# Patient Record
Sex: Female | Born: 1995 | Race: Black or African American | Hispanic: No | Marital: Single | Smoking: Current some day smoker
Health system: Southern US, Community
[De-identification: ages and names within clinical notes are randomized; demographics above are authoritative.]

## PROBLEM LIST (undated history)

## (undated) DIAGNOSIS — R011 Cardiac murmur, unspecified: Secondary | ICD-10-CM

---
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
## 2016-01-18 ENCOUNTER — Encounter: Payer: Medicaid Other | Admitting: Obstetrics and Gynecology

## 2016-04-10 ENCOUNTER — Emergency Department (HOSPITAL_COMMUNITY)
Admission: EM | Admit: 2016-04-10 | Discharge: 2016-04-10 | Disposition: A | Discharge status: Home / Self Care | Payer: Medicaid Other | Attending: Emergency Medicine | Admitting: Emergency Medicine

## 2016-04-10 ENCOUNTER — Encounter (HOSPITAL_COMMUNITY): Payer: Self-pay | Admitting: Emergency Medicine

## 2016-04-10 DIAGNOSIS — R21 Rash and other nonspecific skin eruption: Secondary | ICD-10-CM

## 2016-04-10 DIAGNOSIS — B86 Scabies: Secondary | ICD-10-CM | POA: Diagnosis not present

## 2016-04-10 DIAGNOSIS — F17210 Nicotine dependence, cigarettes, uncomplicated: Secondary | ICD-10-CM | POA: Diagnosis not present

## 2016-04-10 MED ORDER — DIPHENHYDRAMINE HCL 25 MG PO TABS: 25.0000 mg | ORAL_TABLET | Freq: Four times a day (QID) | ORAL | 0 refills | Status: DC | PRN

## 2016-04-10 MED ORDER — DIPHENHYDRAMINE HCL 25 MG PO CAPS
50.0000 mg | ORAL_CAPSULE | Freq: Once | ORAL | Status: AC
  Administered 2016-04-10: 50 mg via ORAL
  Filled 2016-04-10: qty 2

## 2016-04-10 MED ORDER — PERMETHRIN 5 % EX CREA: TOPICAL_CREAM | CUTANEOUS | 1 refills | Status: DC

## 2016-04-10 MED ORDER — DEXAMETHASONE SODIUM PHOSPHATE 10 MG/ML IJ SOLN
10.0000 mg | Freq: Once | INTRAMUSCULAR | Status: AC
  Administered 2016-04-10: 10 mg via INTRAMUSCULAR
  Filled 2016-04-10: qty 1

## 2016-04-10 NOTE — ED Provider Notes (Signed)
[facility]-EMERGENCY DEPT Provider Note   CSN: [id number] Arrival date & time: 04/10/16  1229  First Provider Contact:  12:35 PM  By signing my name below, I, Kari Steele, attest that this documentation has been prepared under the direction and in the presence of Kyrillos Adams Y Amor Hyle, [place]. Electronically Signed: Evon Steele, ED Scribe. 04/10/16. 12:41 PM.   History   Chief Complaint Chief Complaint  Patient presents with  . Rash    HPI Kari Steele is a 20 y.o. female.  The history is provided by the patient. No language interpreter was used.  HPI Comments: Kari Steele is a 20 y.o. female who presents to the Emergency Department complaining of worsening generalized itchy rash that began 1 week prior. Pt states that the rash is mostly located on her torso and spreading. She states they start as bumps and some get bigger. They are itchy but not painful. Denies blisters. Denies drainage. Pt states that tried washing in selsum blue with no relief.  Pt denies being in contact with any with similar rash. Denies fever or chills. Denies recent travel or new exposures. Denies vaginal symptoms. She states she was recently tested for all STDs including syphilis and was negative. States she has never had chicken pox.    No past medical history on file.  There are no active problems to display for this patient.   No past surgical history on file.  OB History    No data available       Home Medications    Prior to Admission medications   Not on File    Family History No family history on file.  Social History Social History  Substance Use Topics  . Smoking status: Current Some Day Smoker    Types: Cigars  . Smokeless tobacco: Not on file  . Alcohol use Yes     Allergies   Review of patient's allergies indicates no known allergies.   Review of Systems Review of Systems  Skin: Positive for rash.     Physical Exam Updated Vital Signs BP 137/75   Pulse (!)  52   Resp 16   Ht  (1.626 m)   Wt 130 lb (59 kg)   SpO2 100%   BMI 22.31 kg/m   Physical Exam  Constitutional: She is oriented to person, place, and time. She appears well-developed and well-nourished. No distress.  HENT:  Head: Normocephalic and atraumatic.  Eyes: Conjunctivae and EOM are normal.  Neck: Neck supple. No tracheal deviation present.  Cardiovascular: Normal rate.   Pulmonary/Chest: Effort normal. No respiratory distress.  Musculoskeletal: Normal range of motion.  Neurological: She is alert and oriented to person, place, and time.  Skin: Skin is warm and dry.  Diffuse papular rash, papules of varying size from 1-79mm to 5-93mm. Most are scratched open and scabbed over. No vesicles or pustules. The rash is in bilateral axilla, bilateral arms, across chest, abdomen, and bilateral inner thigh creases.   Psychiatric: She has a normal mood and affect. Her behavior is normal.  Nursing note and vitals reviewed.    ED Treatments / Results  Labs (all labs ordered are listed, but only abnormal results are displayed) Labs Reviewed - No data to display  EKG  EKG Interpretation None       Radiology No results found.  Procedures Procedures (including critical care time)  Medications Ordered in ED Medications - No data to display   Initial Impression / Assessment and Plan / ED  Course  I have reviewed the triage vital signs and the nursing notes.  Pertinent labs & imaging results that were available during my care of the patient were reviewed by me and considered in my medical decision making (see chart for details).  Clinical Course   Pt presenting with diffuse papular pruritic rash that has evolved over 1.5 weeks. Nobody else at home has similar rash. No new exposures or travel. The rash is the most concentrated in her axilla and inner thigh creases. It appears like scabies though I do not see any burrow lines. We will treat as such with rx for permethrin  cream. Will also give rx for benadryl as needed for itching. Scabies instructions discussed. Encouraged derm and PCP f/u. ER return precautions given.   I personally performed the services described in this documentation, which was scribed in my presence. The recorded information has been reviewed and is accurate.   Final Clinical Impressions(s) / ED Diagnoses   Final diagnoses:  Rash  Scabies    New Prescriptions Discharge Medication List as of 04/10/2016  1:13 PM    START taking these medications   Details  diphenhydrAMINE (BENADRYL) 25 MG tablet Take 1 tablet (25 mg total) by mouth every 6 (six) hours as needed for itching., Starting Sun 04/10/2016, Print    permethrin (ELIMITE) 5 % cream Apply to entire body. Wait 12 hrs then shower. Repeat in one week., Print         Carlene Coria, PA-C 04/10/16 1447    Azalia Bilis, MD 04/10/16 [phone]

## 2016-04-10 NOTE — Discharge Instructions (Signed)
Your rash appears consistent with scabies. Use the Permethrin cream as prescribed. Take benadryl as needed for itching. Follow up with dermatology if your symptoms do not improve.

## 2016-04-10 NOTE — ED Notes (Signed)
PA at the bedside.

## 2016-04-10 NOTE — ED Triage Notes (Signed)
Patient reports itching x 1.5 weeks.  Reports this as generalized.  Denies fevers.

## 2016-04-21 ENCOUNTER — Telehealth (HOSPITAL_COMMUNITY): Payer: Self-pay

## 2016-04-21 NOTE — Telephone Encounter (Signed)
Patient calling states dog ate Rx for Permethrin 5% cream and would like it called in to pharmacy.  Spoke w/S. Sam PA prescriber, OK to call in new Rx as written previously.  Rx called to [facility] [phone] and was left on pharmacy VM.

## 2016-07-19 ENCOUNTER — Encounter (HOSPITAL_COMMUNITY): Payer: Self-pay

## 2016-07-19 ENCOUNTER — Emergency Department (HOSPITAL_COMMUNITY)
Admission: EM | Admit: 2016-07-19 | Discharge: 2016-07-19 | Disposition: A | Discharge status: Home / Self Care | Payer: Medicaid Other | Attending: Emergency Medicine | Admitting: Emergency Medicine

## 2016-07-19 DIAGNOSIS — S060X0A Concussion without loss of consciousness, initial encounter: Secondary | ICD-10-CM | POA: Diagnosis not present

## 2016-07-19 DIAGNOSIS — Z79899 Other long term (current) drug therapy: Secondary | ICD-10-CM | POA: Insufficient documentation

## 2016-07-19 DIAGNOSIS — W1800XA Striking against unspecified object with subsequent fall, initial encounter: Secondary | ICD-10-CM | POA: Insufficient documentation

## 2016-07-19 DIAGNOSIS — Y929 Unspecified place or not applicable: Secondary | ICD-10-CM | POA: Diagnosis not present

## 2016-07-19 DIAGNOSIS — F17290 Nicotine dependence, other tobacco product, uncomplicated: Secondary | ICD-10-CM | POA: Insufficient documentation

## 2016-07-19 DIAGNOSIS — Y9389 Activity, other specified: Secondary | ICD-10-CM | POA: Diagnosis not present

## 2016-07-19 DIAGNOSIS — Y999 Unspecified external cause status: Secondary | ICD-10-CM | POA: Diagnosis not present

## 2016-07-19 DIAGNOSIS — S0003XA Contusion of scalp, initial encounter: Secondary | ICD-10-CM | POA: Diagnosis not present

## 2016-07-19 DIAGNOSIS — S0990XA Unspecified injury of head, initial encounter: Secondary | ICD-10-CM | POA: Diagnosis present

## 2016-07-19 MED ORDER — IBUPROFEN 800 MG PO TABS
800.0000 mg | ORAL_TABLET | Freq: Once | ORAL | Status: AC
  Administered 2016-07-19: 800 mg via ORAL
  Filled 2016-07-19: qty 1

## 2016-07-19 MED ORDER — ONDANSETRON 4 MG PO TBDP
4.0000 mg | ORAL_TABLET | Freq: Once | ORAL | Status: AC
  Administered 2016-07-19: 4 mg via ORAL
  Filled 2016-07-19: qty 1

## 2016-07-19 NOTE — Progress Notes (Addendum)
Patient listed as having Medicaid [place] [employer] insurance without pap.  Patent confirms her pcp is located at [facility] [place].  System updated.
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
## 2016-11-03 ENCOUNTER — Emergency Department (HOSPITAL_COMMUNITY)
Admission: EM | Admit: 2016-11-03 | Discharge: 2016-11-03 | Disposition: A | Discharge status: Home / Self Care | Payer: Medicaid Other | Attending: Emergency Medicine | Admitting: Emergency Medicine

## 2016-11-03 ENCOUNTER — Encounter (HOSPITAL_COMMUNITY): Payer: Self-pay | Admitting: Emergency Medicine

## 2016-11-03 ENCOUNTER — Emergency Department (HOSPITAL_COMMUNITY): Payer: Medicaid Other

## 2016-11-03 DIAGNOSIS — J40 Bronchitis, not specified as acute or chronic: Secondary | ICD-10-CM | POA: Diagnosis not present

## 2016-11-03 DIAGNOSIS — F17290 Nicotine dependence, other tobacco product, uncomplicated: Secondary | ICD-10-CM | POA: Diagnosis not present

## 2016-11-03 DIAGNOSIS — Z79899 Other long term (current) drug therapy: Secondary | ICD-10-CM | POA: Insufficient documentation

## 2016-11-03 DIAGNOSIS — N390 Urinary tract infection, site not specified: Secondary | ICD-10-CM | POA: Insufficient documentation

## 2016-11-03 DIAGNOSIS — R062 Wheezing: Secondary | ICD-10-CM

## 2016-11-03 LAB — URINALYSIS, ROUTINE W REFLEX MICROSCOPIC
BILIRUBIN URINE: NEGATIVE
Glucose, UA: NEGATIVE mg/dL
HGB URINE DIPSTICK: NEGATIVE
KETONES UR: NEGATIVE mg/dL
LEUKOCYTES UA: NEGATIVE
NITRITE: POSITIVE — AB
Protein, ur: NEGATIVE mg/dL
SPECIFIC GRAVITY, URINE: 1.008 (ref 1.005–1.030)
SQUAMOUS EPITHELIAL / LPF: NONE SEEN
pH: 7 (ref 5.0–8.0)

## 2016-11-03 LAB — POC URINE PREG, ED: Preg Test, Ur: NEGATIVE

## 2016-11-03 MED ORDER — PREDNISONE 20 MG PO TABS
60.0000 mg | ORAL_TABLET | Freq: Once | ORAL | Status: AC
  Administered 2016-11-03: 60 mg via ORAL
  Filled 2016-11-03: qty 3

## 2016-11-03 MED ORDER — BENZONATATE 100 MG PO CAPS: 100.0000 mg | ORAL_CAPSULE | Freq: Three times a day (TID) | ORAL | 0 refills | Status: DC

## 2016-11-03 MED ORDER — IPRATROPIUM-ALBUTEROL 0.5-2.5 (3) MG/3ML IN SOLN
3.0000 mL | Freq: Once | RESPIRATORY_TRACT | Status: AC
  Administered 2016-11-03: 3 mL via RESPIRATORY_TRACT
  Filled 2016-11-03: qty 3

## 2016-11-03 MED ORDER — PREDNISONE 20 MG PO TABS: 40.0000 mg | ORAL_TABLET | Freq: Every day | ORAL | 0 refills | Status: DC

## 2016-11-03 MED ORDER — ALBUTEROL SULFATE HFA 108 (90 BASE) MCG/ACT IN AERS
2.0000 | INHALATION_SPRAY | Freq: Once | RESPIRATORY_TRACT | Status: AC
  Administered 2016-11-03: 2 via RESPIRATORY_TRACT
  Filled 2016-11-03: qty 6.7

## 2016-11-03 MED ORDER — NITROFURANTOIN MONOHYD MACRO 100 MG PO CAPS: 100.0000 mg | ORAL_CAPSULE | Freq: Two times a day (BID) | ORAL | 0 refills | Status: DC

## 2016-11-03 MED ORDER — AEROCHAMBER PLUS FLO-VU MEDIUM MISC
1.0000 | Freq: Once | Status: AC
  Administered 2016-11-03: 1
  Filled 2016-11-03: qty 1

## 2016-11-03 NOTE — ED Triage Notes (Signed)
Pt reports nonproductive cough for the past 3 weeks. Coughing episodes cause SOB with wheezing at times. Worse at night. No known hx of asthma.

## 2016-11-03 NOTE — ED Provider Notes (Signed)
[facility]-EMERGENCY DEPT Provider Note   CSN: [id number] Arrival date & time: 11/03/16  [id number]     History   Chief Complaint Chief Complaint  Patient presents with  . Cough    HPI Kari Steele is a 21 y.o. female.  HPI Kari Steele is a 21 y.o. female with no medical problems, presents to ED with complaint of cough and wheezing. Pt reports cough for the last 3 weeks, at times productive, reports associated wheezing. Nothing making her cough better or worse but states it is usually worse at night and keeps her up. She has tried nyquil with no relief. No fever or chills. No nasal congestion or sore throat. No hx of asthma or wheezing in the past. Pt also requests pregnancy test.  History reviewed. No pertinent past medical history.  There are no active problems to display for this patient.   History reviewed. No pertinent surgical history.  OB History    No data available       Home Medications    Prior to Admission medications   Medication Sig Start Date End Date Taking? Authorizing Provider  diphenhydrAMINE (BENADRYL) 25 MG tablet Take 1 tablet (25 mg total) by mouth every 6 (six) hours as needed for itching. 04/10/16   Ace GinsSerena Y Sam, PA-C  ibuprofen (ADVIL,MOTRIN) 200 MG tablet Take 600 mg by mouth every 6 (six) hours as needed for moderate pain.    Historical Provider, MD  permethrin (ELIMITE) 5 % cream Apply to entire body. Wait 12 hrs then shower. Repeat in one week. Patient not taking: Reported on 07/19/2016 04/10/16   Carlene CoriaSerena Y Sam, PA-C    Family History History reviewed. No pertinent family history.  Social History Social History  Substance Use Topics  . Smoking status: Current Some Day Smoker    Types: Cigars  . Smokeless tobacco: Never Used  . Alcohol use Yes     Allergies   Patient has no known allergies.   Review of Systems Review of Systems  Constitutional: Negative for chills and fever.  Respiratory: Positive for cough, chest tightness,  shortness of breath and wheezing.   Cardiovascular: Negative for chest pain, palpitations and leg swelling.  Gastrointestinal: Negative for abdominal pain, diarrhea, nausea and vomiting.  Genitourinary: Negative for dysuria, flank pain and pelvic pain.  Musculoskeletal: Negative for arthralgias, myalgias, neck pain and neck stiffness.  Skin: Negative for rash.  Neurological: Negative for dizziness, weakness and headaches.  All other systems reviewed and are negative.    Physical Exam Updated Vital Signs BP 123/78 (BP Location: Left Arm)   Pulse (!) 56   Temp 98.2 F (36.8 C) (Oral)   Resp 14   Ht 5' 4.5" (1.638 m)   Wt 58.1 kg   SpO2 100%   BMI 21.63 kg/m   Physical Exam  Constitutional: She is oriented to person, place, and time. She appears well-developed and well-nourished. No distress.  HENT:  Head: Normocephalic and atraumatic.  Right Ear: Tympanic membrane, external ear and ear canal normal.  Left Ear: Tympanic membrane, external ear and ear canal normal.  Nose: Mucosal edema and rhinorrhea present.  Mouth/Throat: Uvula is midline and mucous membranes are normal. Posterior oropharyngeal erythema present. No oropharyngeal exudate, posterior oropharyngeal edema or tonsillar abscesses.  Eyes: Conjunctivae are normal.  Neck: Neck supple.  Cardiovascular: Normal rate, regular rhythm, normal heart sounds and intact distal pulses.   Pulmonary/Chest: Effort normal. No respiratory distress. She has wheezes. She has no rales.  Inspiratory and  expiratory wheezes bilaterally. No distress. Speaking in full sentences.   Abdominal: She exhibits no distension.  Musculoskeletal: Normal range of motion.  Neurological: She is alert and oriented to person, place, and time.  Skin: Skin is warm and dry.  Psychiatric: She has a normal mood and affect.  Nursing note and vitals reviewed.    ED Treatments / Results  Labs (all labs ordered are listed, but only abnormal results are  displayed) Labs Reviewed  POC URINE PREG, ED    EKG  EKG Interpretation None       Radiology Dg Chest 2 View  Result Date: 11/03/2016 CLINICAL DATA:  Shortness of breath, and wheezing for the past 3 weeks. Symptoms greatest at night. Former smoker. EXAM: CHEST  2 VIEW COMPARISON:  None in PACs FINDINGS: The lungs are mildly hyperinflated. There is no significant hemidiaphragm flattening. There is no focal infiltrate and no pleural effusion or pneumothorax. The heart and mediastinal structures are normal. The trachea is midline. The bony thorax exhibits no acute abnormality. IMPRESSION: There is no pneumonia nor other acute cardiopulmonary abnormality. Mild hyperinflation may be voluntary or may reflect reactive airway disease. Electronically Signed   By: David  [place] M.D.   On: 11/03/2016 10:19    Procedures Procedures (including critical care time)  Medications Ordered in ED Medications  ipratropium-albuterol (DUONEB) 0.5-2.5 (3) MG/3ML nebulizer solution 3 mL (not administered)  predniSONE (DELTASONE) tablet 60 mg (not administered)     Initial Impression / Assessment and Plan / ED Course  I have reviewed the triage vital signs and the nursing notes.  Pertinent labs & imaging results that were available during my care of the patient were reviewed by me and considered in my medical decision making (see chart for details).    Patient in emergency department with cough for 3 weeks and wheezing. No history of wheezing or asthma in the past. Patient is also requesting pregnancy test. Will check pregnancy test and get chest x-ray. Will do a breathing treatment for wheezing, prednisone.   Patient's chest x-ray is negative. Her wheezing completely resolved after 1 DuoNeb. Her pregnancy test is negative. She apparently also asked to have urinalysis done to the some dysuria, and was concerned about UTI, urinalysis was performed. Urine is nitrite positive with some bacteria. Will treat  with Macrobid. Home with prednisone, inhaler, Tessalon for cough. Patient is afebrile, nontoxic appearing, I do not think this is a bacterial infection, and I do not think she requires antibiotics. She is otherwise stable. Instructed to follow with the primary care doctor.  Vitals:   11/03/16 0826  BP: 123/78  Pulse: (!) 56  Resp: 14  Temp: 98.2 F (36.8 C)  TempSrc: Oral  SpO2: 100%  Weight: 58.1 kg  Height: 5' 4.5" (1.638 m)      Final Clinical Impressions(s) / ED Diagnoses   Final diagnoses:  Bronchitis  Wheezing  Urinary tract infection without hematuria, site unspecified    New Prescriptions Discharge Medication List as of 11/03/2016 11:42 AM    START taking these medications   Details  benzonatate (TESSALON) 100 MG capsule Take 1 capsule (100 mg total) by mouth every 8 (eight) hours., Starting Thu 11/03/2016, Print    nitrofurantoin, macrocrystal-monohydrate, (MACROBID) 100 MG capsule Take 1 capsule (100 mg total) by mouth 2 (two) times daily., Starting Thu 11/03/2016, Print    predniSONE (DELTASONE) 20 MG tablet Take 2 tablets (40 mg total) by mouth daily., Starting Thu 11/03/2016, Print  Jaynie Crumble, PA-C 11/03/16 1530    Jacalyn Lefevre, MD 11/10/16 1332

## 2016-11-03 NOTE — Discharge Instructions (Signed)
Use inhaler 2 puffs every 4 hrs for cough and wheezing. Take prednisone as prescribed until all gone, next dose tomorrow. Take macrobid as prescribed until all gone for UTI. Follow up with primary care doctor or return if not improving.

## 2016-11-27 ENCOUNTER — Encounter (HOSPITAL_COMMUNITY): Payer: Self-pay | Admitting: Emergency Medicine

## 2016-11-27 ENCOUNTER — Emergency Department (HOSPITAL_COMMUNITY)
Admission: EM | Admit: 2016-11-27 | Discharge: 2016-11-27 | Disposition: A | Discharge status: Home / Self Care | Payer: Medicaid Other | Attending: Emergency Medicine | Admitting: Emergency Medicine

## 2016-11-27 ENCOUNTER — Emergency Department (HOSPITAL_COMMUNITY): Payer: Medicaid Other

## 2016-11-27 DIAGNOSIS — Z79899 Other long term (current) drug therapy: Secondary | ICD-10-CM | POA: Diagnosis not present

## 2016-11-27 DIAGNOSIS — J4541 Moderate persistent asthma with (acute) exacerbation: Secondary | ICD-10-CM | POA: Diagnosis not present

## 2016-11-27 DIAGNOSIS — F17290 Nicotine dependence, other tobacco product, uncomplicated: Secondary | ICD-10-CM | POA: Insufficient documentation

## 2016-11-27 DIAGNOSIS — J209 Acute bronchitis, unspecified: Secondary | ICD-10-CM | POA: Diagnosis not present

## 2016-11-27 DIAGNOSIS — R05 Cough: Secondary | ICD-10-CM | POA: Diagnosis present

## 2016-11-27 HISTORY — DX: Cardiac murmur, unspecified: R01.1

## 2016-11-27 MED ORDER — ALBUTEROL SULFATE HFA 108 (90 BASE) MCG/ACT IN AERS
2.0000 | INHALATION_SPRAY | RESPIRATORY_TRACT | Status: DC
  Administered 2016-11-27: 2 via RESPIRATORY_TRACT
  Filled 2016-11-27: qty 6.7

## 2016-11-27 MED ORDER — AZITHROMYCIN 250 MG PO TABS: 250.0000 mg | ORAL_TABLET | Freq: Every day | ORAL | 0 refills | Status: DC

## 2016-11-27 MED ORDER — AZITHROMYCIN 250 MG PO TABS
500.0000 mg | ORAL_TABLET | Freq: Once | ORAL | Status: AC
  Administered 2016-11-27: 500 mg via ORAL
  Filled 2016-11-27: qty 2

## 2016-11-27 MED ORDER — ALBUTEROL SULFATE HFA 108 (90 BASE) MCG/ACT IN AERS: 2.0000 | INHALATION_SPRAY | RESPIRATORY_TRACT | 0 refills | Status: DC | PRN

## 2016-11-27 MED ORDER — PREDNISONE 10 MG PO TABS: ORAL_TABLET | ORAL | 0 refills | Status: DC

## 2016-11-27 MED ORDER — ALBUTEROL SULFATE (2.5 MG/3ML) 0.083% IN NEBU
5.0000 mg | INHALATION_SOLUTION | Freq: Once | RESPIRATORY_TRACT | Status: AC
  Administered 2016-11-27: 5 mg via RESPIRATORY_TRACT
  Filled 2016-11-27: qty 6

## 2016-11-27 MED ORDER — PREDNISONE 20 MG PO TABS
60.0000 mg | ORAL_TABLET | Freq: Every day | ORAL | Status: DC
Start: 2016-11-28 — End: 2016-11-27
  Administered 2016-11-27: 60 mg via ORAL
  Filled 2016-11-27: qty 3

## 2016-11-27 NOTE — ED Notes (Signed)
Bed: WTR9 Expected date:  Expected time:  Means of arrival:  Comments: 

## 2016-11-27 NOTE — ED Provider Notes (Signed)
[facility] DEPT Provider Note   CSN: [id number] Arrival date & time: 11/27/16  1006  By signing my name below, I, Nelwyn SalisburyJoshua Fowler, attest that this documentation has been prepared under the direction and in the presence of non-physician practitioner, Ok EdwardsLeslie Karen Ellesse Antenucci, PA-C . Electronically Signed: Nelwyn SalisburyJoshua Fowler, Scribe. 11/27/2016. 10:33 AM.  History   Chief Complaint Chief Complaint  Patient presents with  . Cough    The history is provided by the patient. No language interpreter was used.    HPI Comments:  Kari Steele is an otherwise healthy 21 y.o. female who presents to the Emergency Department complaining of gradual-onset, constant, persistent cough beginning 7 weeks ago. Pt states that she was seen previously here in the ED and given an oral steroid, inhaler, and tessalon which gave her some temporary relief until she ran out. Pt reports associated wheezing, chest wall pain and SOB secondary to cough. No modifying factors indicated. Pt denies any other symptoms at this time.   Past Medical History:  Diagnosis Date  . Murmur, cardiac     There are no active problems to display for this patient.   History reviewed. No pertinent surgical history.  OB History    No data available       Home Medications    Prior to Admission medications   Medication Sig Start Date End Date Taking? Authorizing Provider  benzonatate (TESSALON) 100 MG capsule Take 1 capsule (100 mg total) by mouth every 8 (eight) hours. 11/03/16   Tatyana Kirichenko, PA-C  diphenhydrAMINE (BENADRYL) 25 MG tablet Take 1 tablet (25 mg total) by mouth every 6 (six) hours as needed for itching. 04/10/16   Ace GinsSerena Y Sam, PA-C  ibuprofen (ADVIL,MOTRIN) 200 MG tablet Take 600 mg by mouth every 6 (six) hours as needed for moderate pain.    Historical Provider, MD  nitrofurantoin, macrocrystal-monohydrate, (MACROBID) 100 MG capsule Take 1 capsule (100 mg total) by mouth 2 (two) times daily. 11/03/16   Tatyana  Kirichenko, PA-C  permethrin (ELIMITE) 5 % cream Apply to entire body. Wait 12 hrs then shower. Repeat in one week. Patient not taking: Reported on 07/19/2016 04/10/16   Ace GinsSerena Y Sam, PA-C  predniSONE (DELTASONE) 20 MG tablet Take 2 tablets (40 mg total) by mouth daily. 11/03/16   Jaynie Crumbleatyana Kirichenko, PA-C    Family History History reviewed. No pertinent family history.  Social History Social History  Substance Use Topics  . Smoking status: Current Some Day Smoker    Types: Cigars  . Smokeless tobacco: Never Used  . Alcohol use Yes     Allergies   Patient has no known allergies.   Review of Systems Review of Systems  Respiratory: Positive for cough, shortness of breath and wheezing.   Cardiovascular: Positive for chest pain.     Physical Exam Updated Vital Signs BP 132/81 (BP Location: Right Arm)   Pulse 68   Temp 99.3 F (37.4 C) (Oral)   Resp 16   SpO2 97%   Physical Exam  Constitutional: She is oriented to person, place, and time. She appears well-developed and well-nourished. No distress.  HENT:  Head: Normocephalic and atraumatic.  Eyes: Conjunctivae are normal.  Cardiovascular: Normal rate.   Pulmonary/Chest: Effort normal. She has wheezes.  Wheezing all lobes.   Abdominal: She exhibits no distension.  Neurological: She is alert and oriented to person, place, and time.  Skin: Skin is warm and dry.  Psychiatric: She has a normal mood and affect.  Nursing note and  vitals reviewed.    ED Treatments / Results  DIAGNOSTIC STUDIES:  Oxygen Saturation is 97% on RA, normal by my interpretation.    COORDINATION OF CARE:  10:55 AM Discussed treatment plan with pt at bedside which includes a breathing treatment and pt agreed to plan.  Labs (all labs ordered are listed, but only abnormal results are displayed) Labs Reviewed - No data to display  EKG  EKG Interpretation None       Radiology No results found.  Procedures Procedures (including  critical care time)  Medications Ordered in ED Medications  albuterol (PROVENTIL) (2.5 MG/3ML) 0.083% nebulizer solution 5 mg (not administered)     Initial Impression / Assessment and Plan / ED Course  I have reviewed the triage vital signs and the nursing notes.  Pertinent labs & imaging results that were available during my care of the patient were reviewed by me and considered in my medical decision making (see chart for details).      Pt symptoms consistent with URI. CXR negative for acute infiltrate. Pt will be discharged with symptomatic treatment.  Discussed return precautions.  Pt is hemodynamically stable & in NAD prior to discharge.  Final Clinical Impressions(s) / ED Diagnoses   Final diagnoses:  Moderate persistent asthma with exacerbation  Acute bronchitis, unspecified organism    New Prescriptions New Prescriptions   ALBUTEROL (PROVENTIL HFA;VENTOLIN HFA) 108 (90 BASE) MCG/ACT INHALER    Inhale 2 puffs into the lungs every 4 (four) hours as needed for wheezing or shortness of breath.   AZITHROMYCIN (ZITHROMAX) 250 MG TABLET    Take 1 tablet (250 mg total) by mouth daily. Take first 2 tablets together, then 1 every day until finished.   PREDNISONE (DELTASONE) 10 MG TABLET    6,5,4,3,2,1 taper   An After Visit Summary was printed and given to the patient.  I personally performed the services in this documentation, which was scribed in my presence.  The recorded information has been reviewed and considered.   Barnet Pall.    Lonia Skinner [place], PA-C 11/27/16 1139    Cathren Laine, MD 11/30/16 [phone]

## 2016-11-27 NOTE — ED Triage Notes (Signed)
Pt c/o nonproductive cough x 7 weeks, SOB and wheezing. Chest pain with cough, cough leaves chest sore between coughs sometimes. Seen for same 11-03-16, given inhaler which helped, but ran out of medicine.

## 2016-11-27 NOTE — Discharge Instructions (Signed)
Return if any problems.  See your Physician for recheck  °

## 2017-06-29 ENCOUNTER — Encounter (HOSPITAL_COMMUNITY): Payer: Self-pay | Admitting: Emergency Medicine

## 2017-06-29 ENCOUNTER — Emergency Department (HOSPITAL_COMMUNITY)
Admission: EM | Admit: 2017-06-29 | Discharge: 2017-06-29 | Disposition: A | Discharge status: Home / Self Care | Payer: BLUE CROSS/BLUE SHIELD | Attending: Emergency Medicine | Admitting: Emergency Medicine

## 2017-06-29 DIAGNOSIS — Z79899 Other long term (current) drug therapy: Secondary | ICD-10-CM | POA: Insufficient documentation

## 2017-06-29 DIAGNOSIS — F17200 Nicotine dependence, unspecified, uncomplicated: Secondary | ICD-10-CM | POA: Insufficient documentation

## 2017-06-29 DIAGNOSIS — K529 Noninfective gastroenteritis and colitis, unspecified: Secondary | ICD-10-CM

## 2017-06-29 DIAGNOSIS — K5289 Other specified noninfective gastroenteritis and colitis: Secondary | ICD-10-CM | POA: Insufficient documentation

## 2017-06-29 DIAGNOSIS — K625 Hemorrhage of anus and rectum: Secondary | ICD-10-CM | POA: Insufficient documentation

## 2017-06-29 LAB — COMPREHENSIVE METABOLIC PANEL
ALT: 14 U/L (ref 14–54)
AST: 20 U/L (ref 15–41)
Albumin: 4.4 g/dL (ref 3.5–5.0)
Alkaline Phosphatase: 58 U/L (ref 38–126)
Anion gap: 9 (ref 5–15)
BILIRUBIN TOTAL: 0.6 mg/dL (ref 0.3–1.2)
BUN: 15 mg/dL (ref 6–20)
CALCIUM: 9.1 mg/dL (ref 8.9–10.3)
CHLORIDE: 102 mmol/L (ref 101–111)
CO2: 26 mmol/L (ref 22–32)
CREATININE: 0.83 mg/dL (ref 0.44–1.00)
Glucose, Bld: 118 mg/dL — ABNORMAL HIGH (ref 65–99)
Potassium: 3.7 mmol/L (ref 3.5–5.1)
Sodium: 137 mmol/L (ref 135–145)
TOTAL PROTEIN: 7.2 g/dL (ref 6.5–8.1)

## 2017-06-29 LAB — ABO/RH: ABO/RH(D): B POS

## 2017-06-29 LAB — TYPE AND SCREEN
ABO/RH(D): B POS
ANTIBODY SCREEN: NEGATIVE

## 2017-06-29 LAB — CBC
HCT: 36.5 % (ref 36.0–46.0)
Hemoglobin: 12.6 g/dL (ref 12.0–15.0)
MCH: 29.7 pg (ref 26.0–34.0)
MCHC: 34.5 g/dL (ref 30.0–36.0)
MCV: 86.1 fL (ref 78.0–100.0)
PLATELETS: 256 10*3/uL (ref 150–400)
RBC: 4.24 MIL/uL (ref 3.87–5.11)
RDW: 13.1 % (ref 11.5–15.5)
WBC: 8.2 10*3/uL (ref 4.0–10.5)

## 2017-06-29 LAB — POC OCCULT BLOOD, ED: Fecal Occult Bld: POSITIVE — AB

## 2017-06-29 LAB — POC URINE PREG, ED: PREG TEST UR: NEGATIVE

## 2017-06-29 MED ORDER — METRONIDAZOLE 500 MG PO TABS: 500.0000 mg | ORAL_TABLET | Freq: Two times a day (BID) | ORAL | 0 refills | Status: DC

## 2017-06-29 MED ORDER — CIPROFLOXACIN HCL 500 MG PO TABS: 500.0000 mg | ORAL_TABLET | Freq: Two times a day (BID) | ORAL | 0 refills | Status: DC

## 2017-06-29 NOTE — ED Provider Notes (Signed)
[facility] DEPT Provider Note   CSN: [id number] Arrival date & time: 06/29/17  0014     History   Chief Complaint Chief Complaint  Patient presents with  . Rectal Bleeding    HPI Kari Steele is a 21 y.o. female.  The history is provided by the patient.  Rectal Bleeding  Quality:  Bright red Amount:  Moderate Timing:  Intermittent Chronicity:  New Context: diarrhea   Similar prior episodes: no   Relieved by:  None tried Worsened by:  Nothing Associated symptoms: abdominal pain   Associated symptoms: no fever, no hematemesis and no vomiting   Abdominal pain:    Location:  Generalized   Quality: aching     Timing:  Constant   Progression:  Improving   Chronicity:  New Risk factors: no hx of IBD   pt reports she had episode of nonbloody diarrhea earlier in the evening Her second episode of diarrhea had bright red blood She reports fatigue She has some abdominal pain  Past Medical History:  Diagnosis Date  . Murmur, cardiac     There are no active problems to display for this patient.   History reviewed. No pertinent surgical history.  OB History    No data available       Home Medications    Prior to Admission medications   Medication Sig Start Date End Date Taking? Authorizing Provider  FLUoxetine (PROZAC) 10 MG tablet Take 20 mg by mouth daily.   Yes [provider]  hydrOXYzine (ATARAX/VISTARIL) 25 MG tablet Take 50 mg by mouth at bedtime.   Yes [provider]  albuterol (PROVENTIL HFA;VENTOLIN HFA) 108 (90 Base) MCG/ACT inhaler Inhale 2 puffs into the lungs every 4 (four) hours as needed for wheezing or shortness of breath. 11/27/16   Elson Areas, PA-C  ciprofloxacin (CIPRO) 500 MG tablet Take 1 tablet (500 mg total) by mouth 2 (two) times daily. One po bid x 7 days 06/29/17   Zadie Rhine, MD  metroNIDAZOLE (FLAGYL) 500 MG tablet Take 1 tablet (500 mg total) by mouth 2 (two) times daily. One po bid x 7 days  06/29/17   Zadie Rhine, MD    Family History Family History  Problem Relation Age of Onset  . Diabetes Other   . Hypertension Other     Social History Social History  Substance Use Topics  . Smoking status: Current Some Day Smoker    Types: Cigars  . Smokeless tobacco: Never Used  . Alcohol use Yes     Allergies   Patient has no known allergies.   Review of Systems Review of Systems  Constitutional: Positive for fatigue. Negative for fever.  Gastrointestinal: Positive for abdominal pain and hematochezia. Negative for hematemesis and vomiting.  Genitourinary: Negative for vaginal bleeding.  All other systems reviewed and are negative.    Physical Exam Updated Vital Signs BP 118/71 (BP Location: Right Arm)   Pulse (!) 45   Temp 98 F (36.7 C) (Oral)   Resp 16   LMP 06/21/2017 (Approximate)   SpO2 100%   Physical Exam CONSTITUTIONAL: Well developed/well nourished HEAD: Normocephalic/atraumatic EYES: EOMI/PERRL, conjunctiva pink ENMT: Mucous membranes moist NECK: supple no meningeal signs SPINE/BACK:entire spine nontender CV: S1/S2 noted, no murmurs/rubs/gallops noted LUNGS: Lungs are clear to auscultation bilaterally, no apparent distress ABDOMEN: soft, nontender, no rebound or guarding, bowel sounds noted throughout abdomen GU:no cva tenderness Rectal - stool brown.  No blood or melena.  Female chaperone present NEURO: Pt is  awake/alert/appropriate, moves all extremitiesx4.  No facial droop.   EXTREMITIES: pulses normal/equal, full ROM SKIN: warm, color normal PSYCH: no abnormalities of mood noted, alert and oriented to situation   ED Treatments / Results  Labs (all labs ordered are listed, but only abnormal results are displayed) Labs Reviewed  COMPREHENSIVE METABOLIC PANEL - Abnormal; Notable for the following:       Result Value   Glucose, Bld 118 (*)    All other components within normal limits  POC OCCULT BLOOD, ED - Abnormal; Notable for  the following:    Fecal Occult Bld POSITIVE (*)    All other components within normal limits  CBC  POC URINE PREG, ED  TYPE AND SCREEN  ABO/RH    EKG  EKG Interpretation None       Radiology No results found.  Procedures Procedures (including critical care time)  Medications Ordered in ED Medications - No data to display   Initial Impression / Assessment and Plan / ED Course  I have reviewed the triage vital signs and the nursing notes.  Pertinent labs   results that were available during my care of the patient were reviewed by me and considered in my medical decision making (see chart for details).     Pt stable Stool was brown but hemoccult positive No hemorrhoids visualized but could have internal hemorrhoids  I gave her Rx for cipro/flagyl if she should developed any other bloody diarrhea, but my suspicion for significant colitis is low  We discussed return precautions   Final Clinical Impressions(s) / ED Diagnoses   Final diagnoses:  Rectal bleeding  Colitis    New Prescriptions Discharge Medication List as of 06/29/2017  4:30 AM    START taking these medications   Details  ciprofloxacin (CIPRO) 500 MG tablet Take 1 tablet (500 mg total) by mouth 2 (two) times daily. One po bid x 7 days, Starting Thu 06/29/2017, Print    metroNIDAZOLE (FLAGYL) 500 MG tablet Take 1 tablet (500 mg total) by mouth 2 (two) times daily. One po bid x 7 days, Starting Thu 06/29/2017, Print         Zadie Rhine, MD 06/29/17 [phone]

## 2017-06-29 NOTE — ED Triage Notes (Signed)
Pt states she is having diarrhea and when she uses the bathroom her stool and the toilet have blood in them  Pt says it goes from maroon colored to bright red  Pt states she has abd cramping before she has to use the bathroom  Pt states she feels very tired

## 2017-06-29 NOTE — ED Notes (Signed)
Patient reports no further bleeding-orthostats completed-patient states her HR is normally in the 40's-patient states she works out a lot and this is normal for her

## 2017-11-14 ENCOUNTER — Emergency Department (HOSPITAL_COMMUNITY)
Admission: EM | Admit: 2017-11-14 | Discharge: 2017-11-14 | Disposition: A | Discharge status: Home / Self Care | Payer: BLUE CROSS/BLUE SHIELD | Attending: Emergency Medicine | Admitting: Emergency Medicine

## 2017-11-14 ENCOUNTER — Encounter (HOSPITAL_COMMUNITY): Payer: Self-pay | Admitting: Emergency Medicine

## 2017-11-14 DIAGNOSIS — Z5321 Procedure and treatment not carried out due to patient leaving prior to being seen by health care provider: Secondary | ICD-10-CM | POA: Insufficient documentation

## 2017-11-14 DIAGNOSIS — M79646 Pain in unspecified finger(s): Secondary | ICD-10-CM | POA: Insufficient documentation

## 2017-11-14 NOTE — ED Notes (Signed)
RN cleaned wound and dressed it.

## 2017-11-14 NOTE — ED Triage Notes (Signed)
Per PT. " I was caring a metal tray at work and it fell and cut my finger". Pt says she felt nauseous. But did not vomit. Pt states finger was bleeding for 30 minutes but bleeding is now controlled. A&OX4 VSS ambulatory

## 2017-11-14 NOTE — ED Notes (Signed)
No answer when called for room x 3 

## 2017-11-14 NOTE — ED Notes (Signed)
Pt called X 2. No answer. Pt will be moved off the floor.

## 2017-11-16 NOTE — ED Notes (Signed)
11/16/2017,  Attempted follow call, no answer.

## 2018-05-10 ENCOUNTER — Ambulatory Visit: Payer: Self-pay | Admitting: Physician Assistant

## 2018-05-16 ENCOUNTER — Encounter: Payer: Self-pay | Admitting: Physician Assistant

## 2018-05-16 ENCOUNTER — Other Ambulatory Visit: Payer: Self-pay

## 2018-05-16 ENCOUNTER — Ambulatory Visit (INDEPENDENT_AMBULATORY_CARE_PROVIDER_SITE_OTHER): Payer: Managed Care, Other (non HMO) | Admitting: Physician Assistant

## 2018-05-16 VITALS — BP 108/62 | HR 70 | Temp 98.4°F | Resp 18 | Ht 65.75 in | Wt 139.0 lb

## 2018-05-16 DIAGNOSIS — Z23 Encounter for immunization: Secondary | ICD-10-CM | POA: Diagnosis not present

## 2018-05-16 DIAGNOSIS — Z13228 Encounter for screening for other metabolic disorders: Secondary | ICD-10-CM | POA: Diagnosis not present

## 2018-05-16 DIAGNOSIS — Z130 Encounter for screening for diseases of the blood and blood-forming organs and certain disorders involving the immune mechanism: Secondary | ICD-10-CM

## 2018-05-16 DIAGNOSIS — Z124 Encounter for screening for malignant neoplasm of cervix: Secondary | ICD-10-CM

## 2018-05-16 DIAGNOSIS — Z113 Encounter for screening for infections with a predominantly sexual mode of transmission: Secondary | ICD-10-CM

## 2018-05-16 DIAGNOSIS — Z3009 Encounter for other general counseling and advice on contraception: Secondary | ICD-10-CM

## 2018-05-16 DIAGNOSIS — Z0000 Encounter for general adult medical examination without abnormal findings: Secondary | ICD-10-CM

## 2018-05-16 DIAGNOSIS — Z13220 Encounter for screening for lipoid disorders: Secondary | ICD-10-CM | POA: Diagnosis not present

## 2018-05-16 LAB — POCT URINE PREGNANCY: PREG TEST UR: NEGATIVE

## 2018-05-16 MED ORDER — LEVONORGEST-ETH ESTRAD 91-DAY 0.15-0.03 &0.01 MG PO TABS: 1.0000 | ORAL_TABLET | Freq: Every day | ORAL | 4 refills | Status: AC

## 2018-05-16 NOTE — Patient Instructions (Addendum)
I have sent your birthcontrol to your pharmacy. You can take this consecutively for 3 months then take a week break. Use condoms for the first 7 days of using birth control as a back up method.    If you have lab work done today you will be contacted with your lab results within the next 2 weeks.  If you have not heard from [place] then please contact us. The fastest way to get your results is to register for My Chart.  Health Maintenance, Female Adopting a healthy lifestyle and getting preventive care can go a long way to promote health and wellness. Talk with your health care provider about what schedule of regular examinations is right for you. This is a good chance for you to check in with your provider about disease prevention and staying healthy. In between checkups, there are plenty of things you can do on your own. Experts have done a lot of research about which lifestyle changes and preventive measures are most likely to keep you healthy. Ask your health care provider for more information. Weight and diet Eat a healthy diet  Be sure to include plenty of vegetables, fruits, low-fat dairy products, and lean protein.  Do not eat a lot of foods high in solid fats, added sugars, or salt.  Get regular exercise. This is one of the most important things you can do for your health. ? Most adults should exercise for at least 150 minutes each week. The exercise should increase your heart rate and make you sweat (moderate-intensity exercise). ? Most adults should also do strengthening exercises at least twice a week. This is in addition to the moderate-intensity exercise.  Maintain a healthy weight  Body mass index (BMI) is a measurement that can be used to identify possible weight problems. It estimates body fat based on height and weight. Your health care provider can help determine your BMI and help you achieve or maintain a healthy weight.  For females 50 years of age and older: ? A BMI below  18.5 is considered underweight. ? A BMI of 18.5 to 24.9 is normal. ? A BMI of 25 to 29.9 is considered overweight. ? A BMI of 30 and above is considered obese.  Watch levels of cholesterol and blood lipids  You should start having your blood tested for lipids and cholesterol at 21 years of age, then have this test every 5 years.  You may need to have your cholesterol levels checked more often if: ? Your lipid or cholesterol levels are high. ? You are older than 21 years of age. ? You are at high risk for heart disease.  Cancer screening Lung Cancer  Lung cancer screening is recommended for adults 61-64 years old who are at high risk for lung cancer because of a history of smoking.  A yearly low-dose CT scan of the lungs is recommended for people who: ? Currently smoke. ? Have quit within the past 15 years. ? Have at least a 30-pack-year history of smoking. A pack year is smoking an average of one pack of cigarettes a day for 1 year.  Yearly screening should continue until it has been 15 years since you quit.  Yearly screening should stop if you develop a health problem that would prevent you from having lung cancer treatment.  Breast Cancer  Practice breast self-awareness. This means understanding how your breasts normally appear and feel.  It also means doing regular breast self-exams. Let your health care provider know  about any changes, no matter how small.  If you are in your 20s or 30s, you should have a clinical breast exam (CBE) by a health care provider every 1-3 years as part of a regular health exam.  If you are 42 or older, have a CBE every year. Also consider having a breast X-ray (mammogram) every year.  If you have a family history of breast cancer, talk to your health care provider about genetic screening.  If you are at high risk for breast cancer, talk to your health care provider about having an MRI and a mammogram every year.  Breast cancer gene (BRCA)  assessment is recommended for women who have family members with BRCA-related cancers. BRCA-related cancers include: ? Breast. ? Ovarian. ? Tubal. ? Peritoneal cancers.  Results of the assessment will determine the need for genetic counseling and BRCA1 and BRCA2 testing.  Cervical Cancer Your health care provider may recommend that you be screened regularly for cancer of the pelvic organs (ovaries, uterus, and vagina). This screening involves a pelvic examination, including checking for microscopic changes to the surface of your cervix (Pap test). You may be encouraged to have this screening done every 3 years, beginning at age 81.  For women ages 84-65, health care providers may recommend pelvic exams and Pap testing every 3 years, or they may recommend the Pap and pelvic exam, combined with testing for human papilloma virus (HPV), every 5 years. Some types of HPV increase your risk of cervical cancer. Testing for HPV may also be done on women of any age with unclear Pap test results.  Other health care providers may not recommend any screening for nonpregnant women who are considered low risk for pelvic cancer and who do not have symptoms. Ask your health care provider if a screening pelvic exam is right for you.  If you have had past treatment for cervical cancer or a condition that could lead to cancer, you need Pap tests and screening for cancer for at least 20 years after your treatment. If Pap tests have been discontinued, your risk factors (such as having a new sexual partner) need to be reassessed to determine if screening should resume. Some women have medical problems that increase the chance of getting cervical cancer. In these cases, your health care provider may recommend more frequent screening and Pap tests.  Colorectal Cancer  This type of cancer can be detected and often prevented.  Routine colorectal cancer screening usually begins at 21 years of age and continues through 21  years of age.  Your health care provider may recommend screening at an earlier age if you have risk factors for colon cancer.  Your health care provider may also recommend using home test kits to check for hidden blood in the stool.  A small camera at the end of a tube can be used to examine your colon directly (sigmoidoscopy or colonoscopy). This is done to check for the earliest forms of colorectal cancer.  Routine screening usually begins at age 73.  Direct examination of the colon should be repeated every 5-10 years through 21 years of age. However, you may need to be screened more often if early forms of precancerous polyps or small growths are found.  Skin Cancer  Check your skin from head to toe regularly.  Tell your health care provider about any new moles or changes in moles, especially if there is a change in a mole's shape or color.  Also tell your health care  provider if you have a mole that is larger than the size of a pencil eraser.  Always use sunscreen. Apply sunscreen liberally and repeatedly throughout the day.  Protect yourself by wearing long sleeves, pants, a wide-brimmed hat, and sunglasses whenever you are outside.  Heart disease, diabetes, and high blood pressure  High blood pressure causes heart disease and increases the risk of stroke. High blood pressure is more likely to develop in: ? People who have blood pressure in the high end of the normal range (130-139/85-89 mm Hg). ? People who are overweight or obese. ? People who are African American.  If you are 55-89 years of age, have your blood pressure checked every 3-5 years. If you are 41 years of age or older, have your blood pressure checked every year. You should have your blood pressure measured twice-once when you are at a hospital or clinic, and once when you are not at a hospital or clinic. Record the average of the two measurements. To check your blood pressure when you are not at a hospital or  clinic, you can use: ? An automated blood pressure machine at a pharmacy. ? A home blood pressure monitor.  If you are between 91 years and 64 years old, ask your health care provider if you should take aspirin to prevent strokes.  Have regular diabetes screenings. This involves taking a blood sample to check your fasting blood sugar level. ? If you are at a normal weight and have a low risk for diabetes, have this test once every three years after 21 years of age. ? If you are overweight and have a high risk for diabetes, consider being tested at a younger age or more often. Preventing infection Hepatitis B  If you have a higher risk for hepatitis B, you should be screened for this virus. You are considered at high risk for hepatitis B if: ? You were born in a country where hepatitis B is common. Ask your health care provider which countries are considered high risk. ? Your parents were born in a high-risk country, and you have not been immunized against hepatitis B (hepatitis B vaccine). ? You have HIV or AIDS. ? You use needles to inject street drugs. ? You live with someone who has hepatitis B. ? You have had sex with someone who has hepatitis B. ? You get hemodialysis treatment. ? You take certain medicines for conditions, including cancer, organ transplantation, and autoimmune conditions.  Hepatitis C  Blood testing is recommended for: ? Everyone born from [birth year] through 1964. ? Anyone with known risk factors for hepatitis C.  Sexually transmitted infections (STIs)  You should be screened for sexually transmitted infections (STIs) including gonorrhea and chlamydia if: ? You are sexually active and are younger than 21 years of age. ? You are older than 21 years of age and your health care provider tells you that you are at risk for this type of infection. ? Your sexual activity has changed since you were last screened and you are at an increased risk for chlamydia or gonorrhea.  Ask your health care provider if you are at risk.  If you do not have HIV, but are at risk, it may be recommended that you take a prescription medicine daily to prevent HIV infection. This is called pre-exposure prophylaxis (PrEP). You are considered at risk if: ? You are sexually active and do not regularly use condoms or know the HIV status of your partner(s). ? You take  drugs by injection. ? You are sexually active with a partner who has HIV.  Talk with your health care provider about whether you are at high risk of being infected with HIV. If you choose to begin PrEP, you should first be tested for HIV. You should then be tested every 3 months for as long as you are taking PrEP. Pregnancy  If you are premenopausal and you may become pregnant, ask your health care provider about preconception counseling.  If you may become pregnant, take 400 to 800 micrograms (mcg) of folic acid every day.  If you want to prevent pregnancy, talk to your health care provider about birth control (contraception). Osteoporosis and menopause  Osteoporosis is a disease in which the bones lose minerals and strength with aging. This can result in serious bone fractures. Your risk for osteoporosis can be identified using a bone density scan.  If you are 10 years of age or older, or if you are at risk for osteoporosis and fractures, ask your health care provider if you should be screened.  Ask your health care provider whether you should take a calcium or vitamin D supplement to lower your risk for osteoporosis.  Menopause may have certain physical symptoms and risks.  Hormone replacement therapy may reduce some of these symptoms and risks. Talk to your health care provider about whether hormone replacement therapy is right for you. Follow these instructions at home:  Schedule regular health, dental, and eye exams.  Stay current with your immunizations.  Do not use any tobacco products including cigarettes,  chewing tobacco, or electronic cigarettes.  If you are pregnant, do not drink alcohol.  If you are breastfeeding, limit how much and how often you drink alcohol.  Limit alcohol intake to no more than 1 drink per day for nonpregnant women. One drink equals 12 ounces of beer, 5 ounces of wine, or 1 ounces of hard liquor.  Do not use street drugs.  Do not share needles.  Ask your health care provider for help if you need support or information about quitting drugs.  Tell your health care provider if you often feel depressed.  Tell your health care provider if you have ever been abused or do not feel safe at home. This information is not intended to replace advice given to you by your health care provider. Make sure you discuss any questions you have with your health care provider. Document Released: 03/21/2011 Document Revised: 02/11/2016 Document Reviewed: 06/09/2015 Elsevier Interactive Patient Education  2018 [employer].  IF you received an x-ray today, you will receive an invoice from [facility]. Please contact [facility] at [phone] with questions or concerns regarding your invoice.   IF you received labwork today, you will receive an invoice from [facility]. Please contact [facility] at [phone] with questions or concerns regarding your invoice.   Our billing staff will not be able to assist you with questions regarding bills from these companies.  You will be contacted with the lab results as soon as they are available. The fastest way to get your results is to activate your My Chart account. Instructions are located on the last page of this paperwork. If you have not heard from [place] regarding the results in 2 weeks, please contact this office.

## 2018-05-16 NOTE — Progress Notes (Signed)
Kari Steele  MRN: [id number] DOB: July 15, 1996  Subjective:  Pt is a 22 y.o. female who presents for annual physical exam.Pt is not fasting today.  Diet: Works at [employer] so eats [employer] a lot. Loves vegetables. Drinks mostly water. No multivitamin. Exercise: No structured exercise. Sleep: 4-5 hours a night. BM: Twice daily  Menses: LMP 05/11/18. Mostly regular, last 3-10 days. She is sexually active with both males and females. Does not always use condoms. Total lifetime partners: "I do not know, rough estimate is 12." Last STD testing was 6 months ago. Has had some new partners since then. Has tried nexplanon and depo but had lots of bleeding so stopped. No past medical history of HTN, heart disease, breast cancer, endometrial cancer, thromboembolism, migraine with aura, or diabetes.  FH of stroke in mother who has uncontrolled HTN. She has been smoking 2 cigarettes a day for the past 2 months.    Last dental exam: Not since middle school, brushes once a day.  Last vision exam: within past year, has Rx eyeglasses.  Last pap smear: Cannot remember if she had one before age 2  Vaccinations      Tetanus: 2009      HPV: completed    There are no active problems to display for this patient.   No current outpatient medications on file prior to visit.   No current facility-administered medications on file prior to visit.     No Known Allergies  Social History   Socioeconomic History  . Marital status: Single    Spouse name: Not on file  . Number of children: 0  . Years of education: Not on file  . Highest education level: Some college, no degree  Occupational History  . Not on file  Social Needs  . Financial resource strain: Not on file  . Food insecurity:    Worry: Not on file    Inability: Not on file  . Transportation needs:    Medical: Not on file    Non-medical: Not on file  Tobacco Use  . Smoking status: Current Some Day Smoker    Packs/day: 0.10    Types: Cigarettes  . Smokeless tobacco: Never Used  . Tobacco comment: 2 cigarrettes a day  Substance and Sexual Activity  . Alcohol use: Yes    Comment: drinks on weekends, "depends on weekend"  . Drug use: No  . Sexual activity: Yes    Partners: Female, Female    Birth control/protection: None  Lifestyle  . Physical activity:    Days per week: 0 days    Minutes per session: 0 min  . Stress: Not on file  Relationships  . Social connections:    Talks on phone: Once a week    Gets together: Twice a week    Attends religious service: Never    Active member of club or organization: No    Attends meetings of clubs or organizations: Never    Relationship status: Never married  Other Topics Concern  . Not on file  Social History Narrative   Was adopted at age 71.    Does not know her biological father.    Has a relationship with biological mother.    Adopted and biological family are in [place], [place].   She completed 2.5 years of college at [employer].   Working at [employer] right now.   Living at home by herself.     History reviewed. No pertinent surgical history.  Family History  Problem Relation Age of Onset  . Diabetes Other   . Hypertension Other   . Stroke Mother   . Drug abuse Mother   . Hypertension Mother   . Diabetes Sister   . Diabetes Paternal Uncle     Review of Systems  Constitutional: Negative for activity change, appetite change, chills, diaphoresis, fatigue, fever and unexpected weight change.  HENT: Negative for congestion, dental problem, drooling, ear discharge, ear pain, facial swelling, hearing loss, mouth sores, nosebleeds, postnasal drip, rhinorrhea, sinus pressure, sinus pain, sneezing, sore throat, tinnitus, trouble swallowing and voice change.   Eyes: Negative for photophobia, pain, discharge, redness, itching and visual disturbance.  Respiratory: Negative for apnea, cough, choking, chest tightness, shortness of breath, wheezing and stridor.     Cardiovascular: Negative for chest pain, palpitations and leg swelling.  Gastrointestinal: Negative for abdominal distention, abdominal pain, anal bleeding, blood in stool, constipation, diarrhea, nausea, rectal pain and vomiting.  Endocrine: Negative for cold intolerance, heat intolerance, polydipsia, polyphagia and polyuria.  Genitourinary: Negative for decreased urine volume, difficulty urinating, dyspareunia, dysuria, enuresis, flank pain, frequency, genital sores, hematuria, menstrual problem, pelvic pain, urgency, vaginal bleeding, vaginal discharge and vaginal pain.  Musculoskeletal: Negative for arthralgias, back pain, gait problem, joint swelling, myalgias, neck pain and neck stiffness.  Skin: Negative for color change, pallor, rash and wound.  Allergic/Immunologic: Negative for environmental allergies, food allergies and immunocompromised state.  Neurological: Negative for dizziness, tremors, seizures, syncope, facial asymmetry, speech difficulty, weakness, light-headedness, numbness and headaches.  Hematological: Negative for adenopathy. Does not bruise/bleed easily.  Psychiatric/Behavioral: Negative for agitation, behavioral problems, confusion, decreased concentration, dysphoric mood, hallucinations, self-injury, sleep disturbance and suicidal ideas. The patient is not nervous/anxious and is not hyperactive.     Objective:  BP 108/62 (BP Location: Right Arm, Patient Position: Sitting, Cuff Size: Normal)   Pulse 70   Temp 98.4 F (36.9 C) (Oral)   Resp 18   Ht 5' 5.75" (1.67 m)   Wt 139 lb (63 kg)   LMP 05/11/2018 (Exact Date)   SpO2 99%   BMI 22.61 kg/m   Physical Exam  Constitutional: She is oriented to person, place, and time. She appears well-developed and well-nourished. No distress.  HENT:  Head: Normocephalic and atraumatic.  Right Ear: Hearing, tympanic membrane, external ear and ear canal normal.  Left Ear: Hearing, tympanic membrane, external ear and ear canal  normal.  Nose: Nose normal.  Mouth/Throat: Uvula is midline, oropharynx is clear and moist and mucous membranes are normal. No oropharyngeal exudate.  Eyes: Pupils are equal, round, and reactive to light. Conjunctivae, EOM and lids are normal. No scleral icterus.  Neck: Trachea normal and normal range of motion. No thyroid mass and no thyromegaly present.  Cardiovascular: Normal rate, regular rhythm, normal heart sounds and intact distal pulses.  Pulmonary/Chest: Effort normal and breath sounds normal.  Abdominal: Soft. Normal appearance and bowel sounds are normal. There is no tenderness.  Genitourinary: Uterus normal. Uterus is not deviated, not enlarged, not fixed and not tender. Cervix exhibits no motion tenderness and no friability. Right adnexum displays no mass, no tenderness and no fullness. Left adnexum displays no mass, no tenderness and no fullness. Vaginal discharge (blood discharge noted, currently on menstrual cycle) found.  Genitourinary Comments: CMA chaperone present for GU exam.   Lymphadenopathy:       Head (right side): No tonsillar, no preauricular, no posterior auricular and no occipital adenopathy present.       Head (left side): No tonsillar, no  preauricular, no posterior auricular and no occipital adenopathy present.    She has no cervical adenopathy.       Right: No supraclavicular adenopathy present.       Left: No supraclavicular adenopathy present.  Neurological: She is alert and oriented to person, place, and time. She has normal strength and normal reflexes.  Skin: Skin is warm and dry.    Visual Acuity Screening   Right eye Left eye Both eyes  Without correction:     With correction: 20/20 20/20 20/15    Pulse Readings from Last 3 Encounters:  05/16/18 70  11/14/17 68  06/29/17 (!) 45   Wt Readings from Last 3 Encounters:  05/16/18 139 lb (63 kg)  11/14/17 128 lb (58.1 kg)  11/03/16 128 lb (58.1 kg) (50 %, Z= 0.00)*   * Growth percentiles are based on  CDC (Girls, 2-20 Years) data.   Results for orders placed or performed in visit on 05/16/18 (from the past 24 hour(s))  POCT urine pregnancy     Status: None   Collection Time: 05/16/18  3:23 PM  Result Value Ref Range   Preg Test, Ur Negative Negative     Assessment and Plan :  Discussed healthy lifestyle, diet, exercise, preventative care, vaccinations, and addressed patient's concerns. Plan for follow up in one year. Otherwise, plan for specific conditions below.   1. Annual physical exam 2. Screening for metabolic disorder - [id number]  3. Screening cholesterol level - Lipid panel  4. Screening for deficiency anemia - CBC with Differential/Platelet  5. Screening for cervical cancer - Pap IG, CT/NG w/ reflex HPV when ASC-U  6. Screen for STD (sexually transmitted disease) - GC/Chlamydia Probe Amp - HIV antibody - Hepatitis panel, acute - RPR - Trichomonas vaginalis, RNA  7. Birth control counseling Point-of-care pregnancy test negative.  Can start OCP.  Educated to use back-up contraceptive method for the first week of OCP use.  Educated pt that OCPs do not prevent STDs, will need to use condom for STD prevention.  Informed patient that using OCP while smoking can increase risk for cardiovascular disease.  Encouraged patient to discontinue smoking. Patient voices her understanding.  - POCT urine pregnancy - Levonorgestrel-Ethinyl Estradiol (AMETHIA,CAMRESE) 0.15-0.03 &0.01 MG tablet; Take 1 tablet by mouth daily.  Dispense: 1 Package; Refill: [place], PA-C  [facility] 05/16/2018 6:50 PM

## 2018-05-17 LAB — HIV ANTIBODY (ROUTINE TESTING W REFLEX): HIV SCREEN 4TH GENERATION: NONREACTIVE

## 2018-05-17 LAB — LIPID PANEL
CHOL/HDL RATIO: 2.2 ratio (ref 0.0–4.4)
CHOLESTEROL TOTAL: 177 mg/dL (ref 100–199)
HDL: 79 mg/dL (ref >39)
LDL Calculated: 65 mg/dL (ref 0–99)
TRIGLYCERIDES: 166 mg/dL — AB (ref 0–149)
VLDL CHOLESTEROL CAL: 33 mg/dL (ref 5–40)

## 2018-05-17 LAB — HEPATITIS PANEL, ACUTE
HEP B C IGM: NEGATIVE
Hep A IgM: NEGATIVE
Hepatitis B Surface Ag: NEGATIVE

## 2018-05-17 LAB — CMP14+EGFR
ALK PHOS: 68 IU/L (ref 39–117)
ALT: 13 IU/L (ref 0–32)
AST: 23 IU/L (ref 0–40)
Albumin/Globulin Ratio: 1.9 (ref 1.2–2.2)
Albumin: 5 g/dL (ref 3.5–5.5)
BUN/Creatinine Ratio: 14 (ref 9–23)
BUN: 11 mg/dL (ref 6–20)
Bilirubin Total: 0.8 mg/dL (ref 0.0–1.2)
CO2: 24 mmol/L (ref 20–29)
CREATININE: 0.77 mg/dL (ref 0.57–1.00)
Calcium: 9.7 mg/dL (ref 8.7–10.2)
Chloride: 101 mmol/L (ref 96–106)
GFR calc Af Amer: 128 mL/min/{1.73_m2} (ref >59)
GFR, EST NON AFRICAN AMERICAN: 111 mL/min/{1.73_m2} (ref >59)
GLOBULIN, TOTAL: 2.6 g/dL (ref 1.5–4.5)
GLUCOSE: 68 mg/dL (ref 65–99)
Potassium: 4.4 mmol/L (ref 3.5–5.2)
Sodium: 142 mmol/L (ref 134–144)
Total Protein: 7.6 g/dL (ref 6.0–8.5)

## 2018-05-17 LAB — CBC WITH DIFFERENTIAL/PLATELET
BASOS ABS: 0.1 10*3/uL (ref 0.0–0.2)
Basos: 1 %
EOS (ABSOLUTE): 0.3 10*3/uL (ref 0.0–0.4)
Eos: 4 %
HEMOGLOBIN: 14 g/dL (ref 11.1–15.9)
Hematocrit: 41.8 % (ref 34.0–46.6)
IMMATURE GRANS (ABS): 0 10*3/uL (ref 0.0–0.1)
Immature Granulocytes: 0 %
LYMPHS: 31 %
Lymphocytes Absolute: 2.4 10*3/uL (ref 0.7–3.1)
MCH: 30 pg (ref 26.6–33.0)
MCHC: 33.5 g/dL (ref 31.5–35.7)
MCV: 90 fL (ref 79–97)
MONOCYTES: 6 %
Monocytes Absolute: 0.5 10*3/uL (ref 0.1–0.9)
NEUTROS PCT: 58 %
Neutrophils Absolute: 4.6 10*3/uL (ref 1.4–7.0)
Platelets: 321 10*3/uL (ref 150–450)
RBC: 4.66 x10E6/uL (ref 3.77–5.28)
RDW: 13.9 % (ref 12.3–15.4)
WBC: 7.8 10*3/uL (ref 3.4–10.8)

## 2018-05-17 LAB — TRICHOMONAS VAGINALIS, PROBE AMP: TRICH VAG BY NAA: NEGATIVE

## 2018-05-17 LAB — GC/CHLAMYDIA PROBE AMP
Chlamydia trachomatis, NAA: NEGATIVE
Neisseria gonorrhoeae by PCR: NEGATIVE

## 2018-05-17 LAB — RPR: RPR: NONREACTIVE

## 2018-05-22 ENCOUNTER — Telehealth: Payer: Self-pay | Admitting: Physician Assistant

## 2018-05-22 LAB — PAP IG, CT-NG, RFX HPV ASCU
Chlamydia, Nuc. Acid Amp: NEGATIVE
GONOCOCCUS BY NUCLEIC ACID AMP: NEGATIVE
PAP Smear Comment: 0

## 2018-05-22 NOTE — Telephone Encounter (Signed)
Copied from CRM [id number]. Topic: Inquiry >> May 22, 2018  1:26 PM Windy Kalata, NT wrote: Reason for CRM: patient is requesting lab results from 05/16/18

## 2018-05-24 NOTE — Telephone Encounter (Signed)
LMOVM - advised to check for labs and message from [place] in [place]

## 2018-06-04 ENCOUNTER — Ambulatory Visit: Payer: Managed Care, Other (non HMO) | Admitting: Family Medicine

## 2019-03-07 ENCOUNTER — Ambulatory Visit: Payer: Self-pay | Admitting: *Deleted

## 2019-03-07 NOTE — Telephone Encounter (Signed)
Pt reports left sided chest pain, "Right below collar bone" onset yesterday. States duration of 1 minute when occurs, rates at  7/10. Also reports mild SOB with minimal exertion. States HR is usually "Slow, can tell its faster." Is not experiencing symptoms presently.  [place] called practice, [facility]ry[facility] Montes, advised to go to UC as no availability. Pt verbalizes understanding, states will go to UC.  Marland Kitchen Pt would like TOC appt with female provider for physical in future.  CB# [phone] Reason for Disposition . [1] Chest pain lasting <= 5 minutes AND [2] NO chest pain or cardiac symptoms now(Exceptions: pains lasting a few seconds)  Answer Assessment - Initial Assessment Questions 1. LOCATION: "Where does it hurt?"       Top left chest 2. RADIATION: "Does the pain go anywhere else?" (e.g., into neck, jaw, arms, back)     no 3. ONSET: "When did the chest pain begin?" (Minutes, hours or days)      yesterday 4. PATTERN "Does the pain come and go, or has it been constant since it started?"  "Does it get worse with exertion?"      Intermittent , not worse with exertion 5. DURATION: "How long does it last" (e.g., seconds, minutes, hours)    1 minute 6. SEVERITY: "How bad is the pain?"  (e.g., Scale 1-10; mild, moderate, or severe)    - MILD (1-3): doesn't interfere with normal activities     - MODERATE (4-7): interferes with normal activities or awakens from sleep    - SEVERE (8-10): excruciating pain, unable to do any normal activities       Varies, 7/10 when occurs. 7. CARDIAC RISK FACTORS: "Do you have any history of heart problems or risk factors for heart disease?" (e.g., prior heart attack, angina; high blood pressure, diabetes, being overweight, high cholesterol, smoking, or strong family history of heart disease)     *No Answer* 8. PULMONARY RISK FACTORS: "Do you have any history of lung disease?"  (e.g., blood clots in lung, asthma, emphysema, birth control pills)     *No Answer* 9. CAUSE: "What do  you think is causing the chest pain?"    unsure 10. OTHER SYMPTOMS: "Do you have any other symptoms?" (e.g., dizziness, nausea, vomiting, sweating, fever, difficulty breathing, cough)      Sob with minimal exertion 11. PREGNANCY: "Is there any chance you are pregnant?" "When was your last menstrual period?"       *No Answer*  Protocols used: CHEST PAIN-A-AH

## 2019-06-01 ENCOUNTER — Telehealth: Payer: Medicaid Other

## 2019-10-29 DIAGNOSIS — Z13220 Encounter for screening for lipoid disorders: Secondary | ICD-10-CM | POA: Diagnosis not present

## 2019-10-29 DIAGNOSIS — R350 Frequency of micturition: Secondary | ICD-10-CM | POA: Diagnosis not present

## 2019-10-29 DIAGNOSIS — Z114 Encounter for screening for human immunodeficiency virus [HIV]: Secondary | ICD-10-CM | POA: Diagnosis not present

## 2019-10-29 DIAGNOSIS — F17210 Nicotine dependence, cigarettes, uncomplicated: Secondary | ICD-10-CM | POA: Diagnosis not present

## 2019-10-29 DIAGNOSIS — Z0000 Encounter for general adult medical examination without abnormal findings: Secondary | ICD-10-CM | POA: Diagnosis not present

## 2020-11-12 DIAGNOSIS — Z124 Encounter for screening for malignant neoplasm of cervix: Secondary | ICD-10-CM | POA: Diagnosis not present

## 2020-11-12 DIAGNOSIS — N910 Primary amenorrhea: Secondary | ICD-10-CM | POA: Diagnosis not present

## 2020-11-12 DIAGNOSIS — Z113 Encounter for screening for infections with a predominantly sexual mode of transmission: Secondary | ICD-10-CM | POA: Diagnosis not present

## 2020-11-12 DIAGNOSIS — Z833 Family history of diabetes mellitus: Secondary | ICD-10-CM | POA: Diagnosis not present

## 2020-11-12 DIAGNOSIS — Z0000 Encounter for general adult medical examination without abnormal findings: Secondary | ICD-10-CM | POA: Diagnosis not present

## 2021-07-11 ENCOUNTER — Ambulatory Visit
Admission: EM | Admit: 2021-07-11 | Discharge: 2021-07-11 | Disposition: A | Discharge status: Home / Self Care | Payer: 59 | Attending: Emergency Medicine | Admitting: Emergency Medicine

## 2021-07-11 ENCOUNTER — Encounter: Payer: Self-pay | Admitting: Emergency Medicine

## 2021-07-11 ENCOUNTER — Ambulatory Visit (INDEPENDENT_AMBULATORY_CARE_PROVIDER_SITE_OTHER): Payer: 59

## 2021-07-11 ENCOUNTER — Other Ambulatory Visit: Payer: Self-pay

## 2021-07-11 DIAGNOSIS — R059 Cough, unspecified: Secondary | ICD-10-CM | POA: Diagnosis not present

## 2021-07-11 DIAGNOSIS — R093 Abnormal sputum: Secondary | ICD-10-CM | POA: Diagnosis not present

## 2021-07-11 DIAGNOSIS — B349 Viral infection, unspecified: Secondary | ICD-10-CM

## 2021-07-11 DIAGNOSIS — R042 Hemoptysis: Secondary | ICD-10-CM

## 2021-07-11 NOTE — ED Triage Notes (Signed)
Pt is currently a poor historian, but states she is coughing up blood since this morning and has been sick with a cold for 4 days. Sore throat, nasal congestion and cough. At home covid tests are negative.

## 2021-07-11 NOTE — Discharge Instructions (Addendum)
Your chest x-ray is normal.    Your COVID test is pending.  You should self quarantine until the test result is back.    Take Tylenol or ibuprofen as needed for fever or discomfort.  Rest and keep yourself hydrated.    Follow-up with your primary care provider if your symptoms are not improving.

## 2021-07-11 NOTE — ED Provider Notes (Signed)
[facility]    CSN: [id number] Arrival date & time: 07/11/21  1135      History   Chief Complaint Chief Complaint  Patient presents with   Cough   Sore Throat   Nasal Congestion     HPI Kari Steele is a 25 y.o. female.  Patient presents with runny nose, sore throat, cough x4 days.  She reports coughing up blood-tinged sputum this morning.  She denies fever, chills, rash, shortness of breath, or other symptoms.  OTC treatment attempted at home.  She occasionally smokes and vapes.  LMP current.   The history is provided by the patient and medical records.   Past Medical History:  Diagnosis Date   Murmur, cardiac     There are no problems to display for this patient.   History reviewed. No pertinent surgical history.  OB History   No obstetric history on file.      Home Medications    Prior to Admission medications   Medication Sig Start Date End Date Taking? Authorizing Provider  Levonorgestrel-Ethinyl Estradiol (AMETHIA,CAMRESE) 0.15-0.03 &0.01 MG tablet Take 1 tablet by mouth daily. 05/16/18   Magdalene River, PA-C    Family History Family History  Problem Relation Age of Onset   Diabetes Other    Hypertension Other    Stroke Mother    Drug abuse Mother    Hypertension Mother    Diabetes Sister    Diabetes Paternal Uncle     Social History Social History   Tobacco Use   Smoking status: Some Days    Packs/day: 0.10    Types: Cigarettes   Smokeless tobacco: Never   Tobacco comments:    2 cigarrettes a day  Vaping Use   Vaping Use: Never used  Substance Use Topics   Alcohol use: Yes    Comment: drinks on weekends, "depends on weekend"   Drug use: No     Allergies   Patient has no known allergies.   Review of Systems Review of Systems  Constitutional:  Negative for chills and fever.  HENT:  Positive for rhinorrhea and sore throat. Negative for ear pain.   Respiratory:  Positive for cough. Negative for shortness of  breath and wheezing.   Cardiovascular:  Negative for chest pain and palpitations.  Gastrointestinal:  Negative for abdominal pain, diarrhea and vomiting.  Skin:  Negative for color change and rash.  All other systems reviewed and are negative.   Physical Exam Triage Vital Signs ED Triage Vitals  Enc Vitals Group     BP      Pulse      Resp      Temp      Temp src      SpO2      Weight      Height      Head Circumference      Peak Flow      Pain Score      Pain Loc      Pain Edu?      Excl. in GC?    No data found.  Updated Vital Signs BP 113/68 (BP Location: Left Arm)   Pulse (!) 56   Temp 97.9 F (36.6 C) (Oral)   Resp 18   LMP 07/08/2021 (Approximate)   SpO2 99%   Visual Acuity Right Eye Distance:   Left Eye Distance:   Bilateral Distance:    Right Eye Near:   Left Eye Near:    Bilateral  Near:     Physical Exam Vitals and nursing note reviewed.  Constitutional:      General: She is not in acute distress.    Appearance: She is well-developed. She is not ill-appearing.  HENT:     Head: Normocephalic and atraumatic.     Right Ear: Tympanic membrane normal.     Left Ear: Tympanic membrane normal.     Nose: Nose normal.     Mouth/Throat:     Mouth: Mucous membranes are moist.     Pharynx: Oropharynx is clear.  Eyes:     Conjunctiva/sclera: Conjunctivae normal.  Cardiovascular:     Rate and Rhythm: Normal rate and regular rhythm.     Heart sounds: Normal heart sounds.  Pulmonary:     Effort: Pulmonary effort is normal. No respiratory distress.     Breath sounds: Normal breath sounds. No wheezing or rhonchi.  Abdominal:     Palpations: Abdomen is soft.     Tenderness: There is no abdominal tenderness.  Musculoskeletal:     Cervical back: Neck supple.  Skin:    General: Skin is warm and dry.  Neurological:     General: No focal deficit present.     Mental Status: She is alert and oriented to person, place, and time.     Gait: Gait normal.   Psychiatric:        Mood and Affect: Mood normal.        Behavior: Behavior normal.     UC Treatments / Results  Labs (all labs ordered are listed, but only abnormal results are displayed) Labs Reviewed - No data to display  EKG   Radiology DG Chest 2 View  Result Date: 07/11/2021 CLINICAL DATA:  Cough with bloody sputum. EXAM: CHEST - 2 VIEW COMPARISON:  11/27/2016 FINDINGS: Heart size is normal. Lungs are free of focal consolidations and pleural effusions. No pulmonary edema. IMPRESSION: No active cardiopulmonary disease. Electronically Signed   By: Norva Pavlov M.D.   On: 07/11/2021 12:16    Procedures Procedures (including critical care time)  Medications Ordered in UC Medications - No data to display  Initial Impression / Assessment and Plan / UC Course  I have reviewed the triage vital signs and the nursing notes.  Pertinent labs & imaging results that were available during my care of the patient were reviewed by me and considered in my medical decision making (see chart for details).  Hemoptysis, Viral illness.  Patient is well-appearing and her exam is reassuring.  Vital signs are stable.  Chest x-ray negative.  COVID pending.  Instructed patient to self quarantine per CDC guidelines.  Discussed symptomatic treatment including Tylenol or ibuprofen, rest, hydration.  Instructed patient to follow up with PCP if symptoms are not improving.  Patient agrees to plan of care.    Final Clinical Impressions(s) / UC Diagnoses   Final diagnoses:  Hemoptysis  Viral illness     Discharge Instructions      Your chest x-ray is normal.    Your COVID test is pending.  You should self quarantine until the test result is back.    Take Tylenol or ibuprofen as needed for fever or discomfort.  Rest and keep yourself hydrated.    Follow-up with your primary care provider if your symptoms are not improving.         ED Prescriptions   None    PDMP not reviewed this  encounter.   Mickie Bail, NP 07/11/21 1230

## 2021-07-12 LAB — SARS-COV-2, NAA 2 DAY TAT

## 2021-07-12 LAB — NOVEL CORONAVIRUS, NAA: SARS-CoV-2, NAA: NOT DETECTED

## 2021-08-04 ENCOUNTER — Ambulatory Visit
Admission: EM | Admit: 2021-08-04 | Discharge: 2021-08-04 | Disposition: A | Discharge status: Home / Self Care | Payer: 59 | Attending: Emergency Medicine | Admitting: Emergency Medicine

## 2021-08-04 ENCOUNTER — Other Ambulatory Visit: Payer: Self-pay

## 2021-08-04 ENCOUNTER — Encounter: Payer: Self-pay | Admitting: Emergency Medicine

## 2021-08-04 DIAGNOSIS — H00024 Hordeolum internum left upper eyelid: Secondary | ICD-10-CM

## 2021-08-04 MED ORDER — ERYTHROMYCIN 5 MG/GM OP OINT: TOPICAL_OINTMENT | OPHTHALMIC | 0 refills | Status: AC

## 2021-08-04 NOTE — Discharge Instructions (Signed)
A stye is an infection in the root of an eyelash. The infection causes a tender red lump on the edge of the eyelid. The infection can spread until the whole eyelid becomes red and inflamed. Styes usually break open, and a tiny amount of pus drains. They usually clear up on their own in about a week, but they sometimes need treatment with antibiotics.  To treat an infection of the eyelid: Do not rub your eyes. Do not squeeze or try to open a stye or chalazion. To help a stye or chalazion heal faster: Put a warm, moist compress on your eye for 5 to 10 minutes, 3 to 6 times a day. Heat often brings a stye to a point where it drains on its own. Keep in mind that warm compresses will often increase swelling a little at first. Do not use hot water or heat a wet cloth in a microwave oven. The compress may get too hot and can burn the eyelid. Always wash your hands before and after you use a compress or touch your eyes. Do not wear eye makeup or contact lenses until the stye or chalazion heals. Do not share towels, pillows, or washcloths while you have a stye.  Return to clinic or go to the ER if you have pain in the eye, change or loss in vision, worsening redness or swelling.

## 2021-08-04 NOTE — ED Triage Notes (Signed)
Pt here with left upper eyelid swelling and pain x 2 days. No vision changes.

## 2021-08-04 NOTE — ED Provider Notes (Signed)
Chief Complaint   Chief Complaint  Patient presents with   Eye Problem     Subjective  Kari Steele is a 25 y.o. female who presents with left eye pain and swelling to upper eyelid onset 2 days ago. No known injury. No purulent drainage. Reports excessive tearing. No visual changes.  History obtained from patient.  Patient's past medical, social, surgical, and medication history were reviewed by me and updated in Epic.    Review of Systems   See HPI.    Objective   Vitals:   08/04/21 1429  BP: 131/85  Pulse: (!) 54  Resp: 18  Temp: 98.5 F (36.9 C)  SpO2: 98%            General:  Alert, cooperative, appears stated age. No acute distress  Eyes:  Bilateral: PEERLA, EOMI Right: WNL Left: Internal hordeolum noted to left upper eyelid. No drainage. No injected conjunctiva.  Vision: No results found.  Fluorescein:   Non      Assessment & Plan  1. Hordeolum internum left upper eyelid - erythromycin ophthalmic ointment; Place a 1/2 inch ribbon of ointment into the lower eyelid nightly for 7 days.  Dispense: 3.5 g; Refill: 0  25 y.o. female presents with left eye pain and swelling to upper eyelid onset 2 days ago. No known injury. No purulent drainage. Reports excessive tearing. No visual changes.  Given symptoms along with assessment findings, likely hordeolum internum of left upper eyelid.  Rx'd Romycin ointment to the patient's preferred pharmacy and advised about home treatment and care to include heat compresses.  Return to clinic or go to the emergency department with any pain in the eye, change or loss of vision, worsening redness or swelling.  Patient verbalized understanding and agreed with plan.  Patient stable upon discharge.  Return as needed.  Plan:   Discharge Instructions      A stye is an infection in the root of an eyelash. The infection causes a tender red lump on the edge of the eyelid. The infection can spread until the whole eyelid becomes red and  inflamed. Styes usually break open, and a tiny amount of pus drains. They usually clear up on their own in about a week, but they sometimes need treatment with antibiotics.  To treat an infection of the eyelid: Do not rub your eyes. Do not squeeze or try to open a stye or chalazion. To help a stye or chalazion heal faster: Put a warm, moist compress on your eye for 5 to 10 minutes, 3 to 6 times a day. Heat often brings a stye to a point where it drains on its own. Keep in mind that warm compresses will often increase swelling a little at first. Do not use hot water or heat a wet cloth in a microwave oven. The compress may get too hot and can burn the eyelid. Always wash your hands before and after you use a compress or touch your eyes. Do not wear eye makeup or contact lenses until the stye or chalazion heals. Do not share towels, pillows, or washcloths while you have a stye.  Return to clinic or go to the ER if you have pain in the eye, change or loss in vision, worsening redness or swelling.          Amalia Greenhouse, FNP 08/04/21 1452

## 2022-04-30 ENCOUNTER — Ambulatory Visit: Payer: 59

## 2022-07-06 ENCOUNTER — Ambulatory Visit
Admission: RE | Admit: 2022-07-06 | Discharge: 2022-07-06 | Disposition: A | Discharge status: Home / Self Care | Payer: 59 | Source: Ambulatory Visit

## 2022-07-06 VITALS — BP 119/75 | HR 60 | Temp 97.7°F | Resp 17

## 2022-07-06 DIAGNOSIS — R197 Diarrhea, unspecified: Secondary | ICD-10-CM

## 2022-07-06 NOTE — ED Triage Notes (Signed)
Pt. Presents to UC w/c/o diarrhea since [place] the [place]. Pt. Is requesting a note for work.

## 2022-07-06 NOTE — ED Provider Notes (Signed)
[facility]    CSN: [id number] Arrival date & time: 07/06/22  1057      History   Chief Complaint Chief Complaint  Patient presents with   Diarrhea    Entered by patient    HPI Kari Steele is a 26 y.o. female.    Diarrhea   Patient presents to UC with complaint of diarrhea since yesterday after visiting the [facility] [employer].  She endorses eating several different varieties of fair food including smoke [place] leg, sausage, Oreos (presumably fried).  Past Medical History:  Diagnosis Date   Murmur, cardiac     There are no problems to display for this patient.   History reviewed. No pertinent surgical history.  OB History   No obstetric history on file.      Home Medications    Prior to Admission medications   Medication Sig Start Date End Date Taking? Authorizing Provider  medroxyPROGESTERone Acetate 150 MG/ML SUSY Inject into the muscle. 08/25/21  Yes [provider]  erythromycin ophthalmic ointment Place a 1/2 inch ribbon of ointment into the lower eyelid nightly for 7 days. 08/04/21   Boddu, Belenda Cruise, FNP  ibuprofen (ADVIL) 200 MG tablet Take by mouth.    [provider]  Levonorgestrel-Ethinyl Estradiol (AMETHIA,CAMRESE) 0.15-0.03 &0.01 MG tablet Take 1 tablet by mouth daily. 05/16/18   Benjiman Core D, PA-C  nitrofurantoin, macrocrystal-monohydrate, (MACROBID) 100 MG capsule Take 100 mg by mouth every 12 (twelve) hours. 06/01/22   [provider]  phenazopyridine (PYRIDIUM) 200 MG tablet Take 200 mg by mouth 3 (three) times daily as needed. 06/02/22   [provider]    Family History Family History  Problem Relation Age of Onset   Diabetes Other    Hypertension Other    Stroke Mother    Drug abuse Mother    Hypertension Mother    Diabetes Sister    Diabetes Paternal Uncle     Social History Social History   Tobacco Use   Smoking status: Some Days    Packs/day: 0.10    Types: Cigarettes    Smokeless tobacco: Never   Tobacco comments:    2 cigarrettes a day  Vaping Use   Vaping Use: Never used  Substance Use Topics   Alcohol use: Yes    Comment: drinks on weekends, "depends on weekend"   Drug use: No     Allergies   Patient has no known allergies.   Review of Systems Review of Systems  Gastrointestinal:  Positive for diarrhea.     Physical Exam Triage Vital Signs ED Triage Vitals [07/06/22 1107]  Enc Vitals Group     BP 119/75     Pulse Rate 60     Resp 17     Temp 97.7 F (36.5 C)     Temp src      SpO2 98 %     Weight      Height      Head Circumference      Peak Flow      Pain Score 0     Pain Loc      Pain Edu?      Excl. in GC?    No data found.  Updated Vital Signs BP 119/75   Pulse 60   Temp 97.7 F (36.5 C)   Resp 17   LMP 06/06/2022 (Approximate)   SpO2 98%   Visual Acuity Right Eye Distance:   Left Eye Distance:   Bilateral Distance:  Right Eye Near:   Left Eye Near:    Bilateral Near:     Physical Exam Vitals reviewed.  Constitutional:      Appearance: Normal appearance.  Abdominal:     Palpations: Abdomen is soft.     Tenderness: There is abdominal tenderness in the right lower quadrant.  Skin:    General: Skin is warm and dry.  Neurological:     General: No focal deficit present.     Mental Status: She is alert and oriented to person, place, and time.  Psychiatric:        Mood and Affect: Mood normal.        Behavior: Behavior normal.      UC Treatments / Results  Labs (all labs ordered are listed, but only abnormal results are displayed) Labs Reviewed - No data to display  EKG   Radiology No results found.  Procedures Procedures (including critical care time)  Medications Ordered in UC Medications - No data to display  Initial Impression / Assessment and Plan / UC Course  I have reviewed the triage vital signs and the nursing notes.  Pertinent labs & imaging results that were available  during my care of the patient were reviewed by me and considered in my medical decision making (see chart for details).   RLQ tenderness with palpation.  Patient states she needs to have BM.  No acute abdomen.  Likely foodborne etiology though no way to identify specific source or contaminant.  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   Final Clinical Impressions(s) / UC Diagnoses   Final diagnoses:  None   Discharge Instructions   None    ED Prescriptions   None    PDMP not reviewed this encounter.   Rose Phi, [place] 07/06/22 1130

## 2022-07-06 NOTE — Discharge Instructions (Addendum)
You may return to work as soon as your diarrhea resolves.  Please present for reevaluation if no resolution within 2 more days.  Drink fluids to avoid dehydration.  Follow up here or with your primary care provider if your symptoms are worsening or not improving.

## 2023-02-02 IMAGING — DX DG CHEST 2V
2 series · 2 of 2 positions shown · non-contrast
Comparison: 11/27/2016

CLINICAL DATA: Cough with bloody sputum.

EXAM:
CHEST - 2 VIEW

[chest pa]
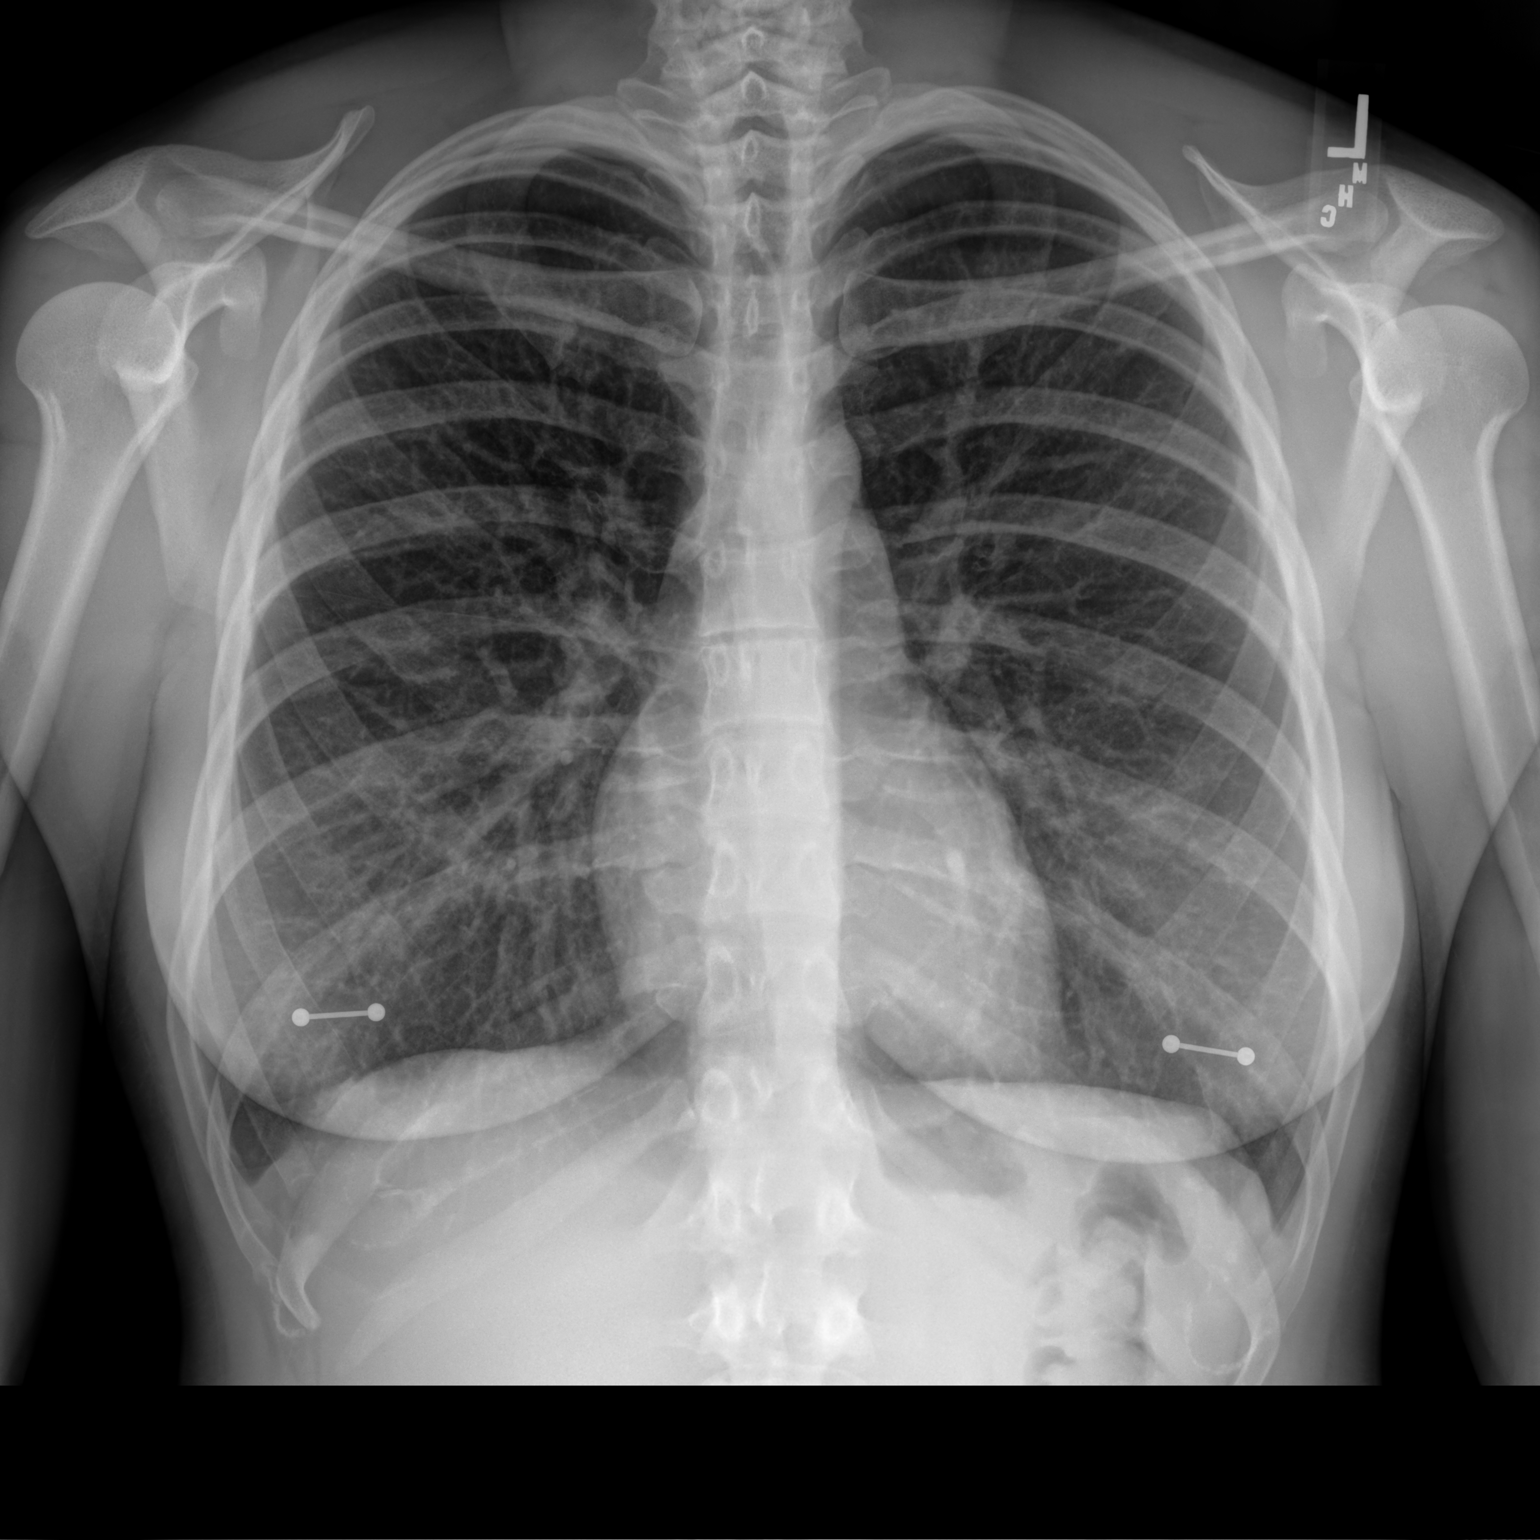

[chest lat]
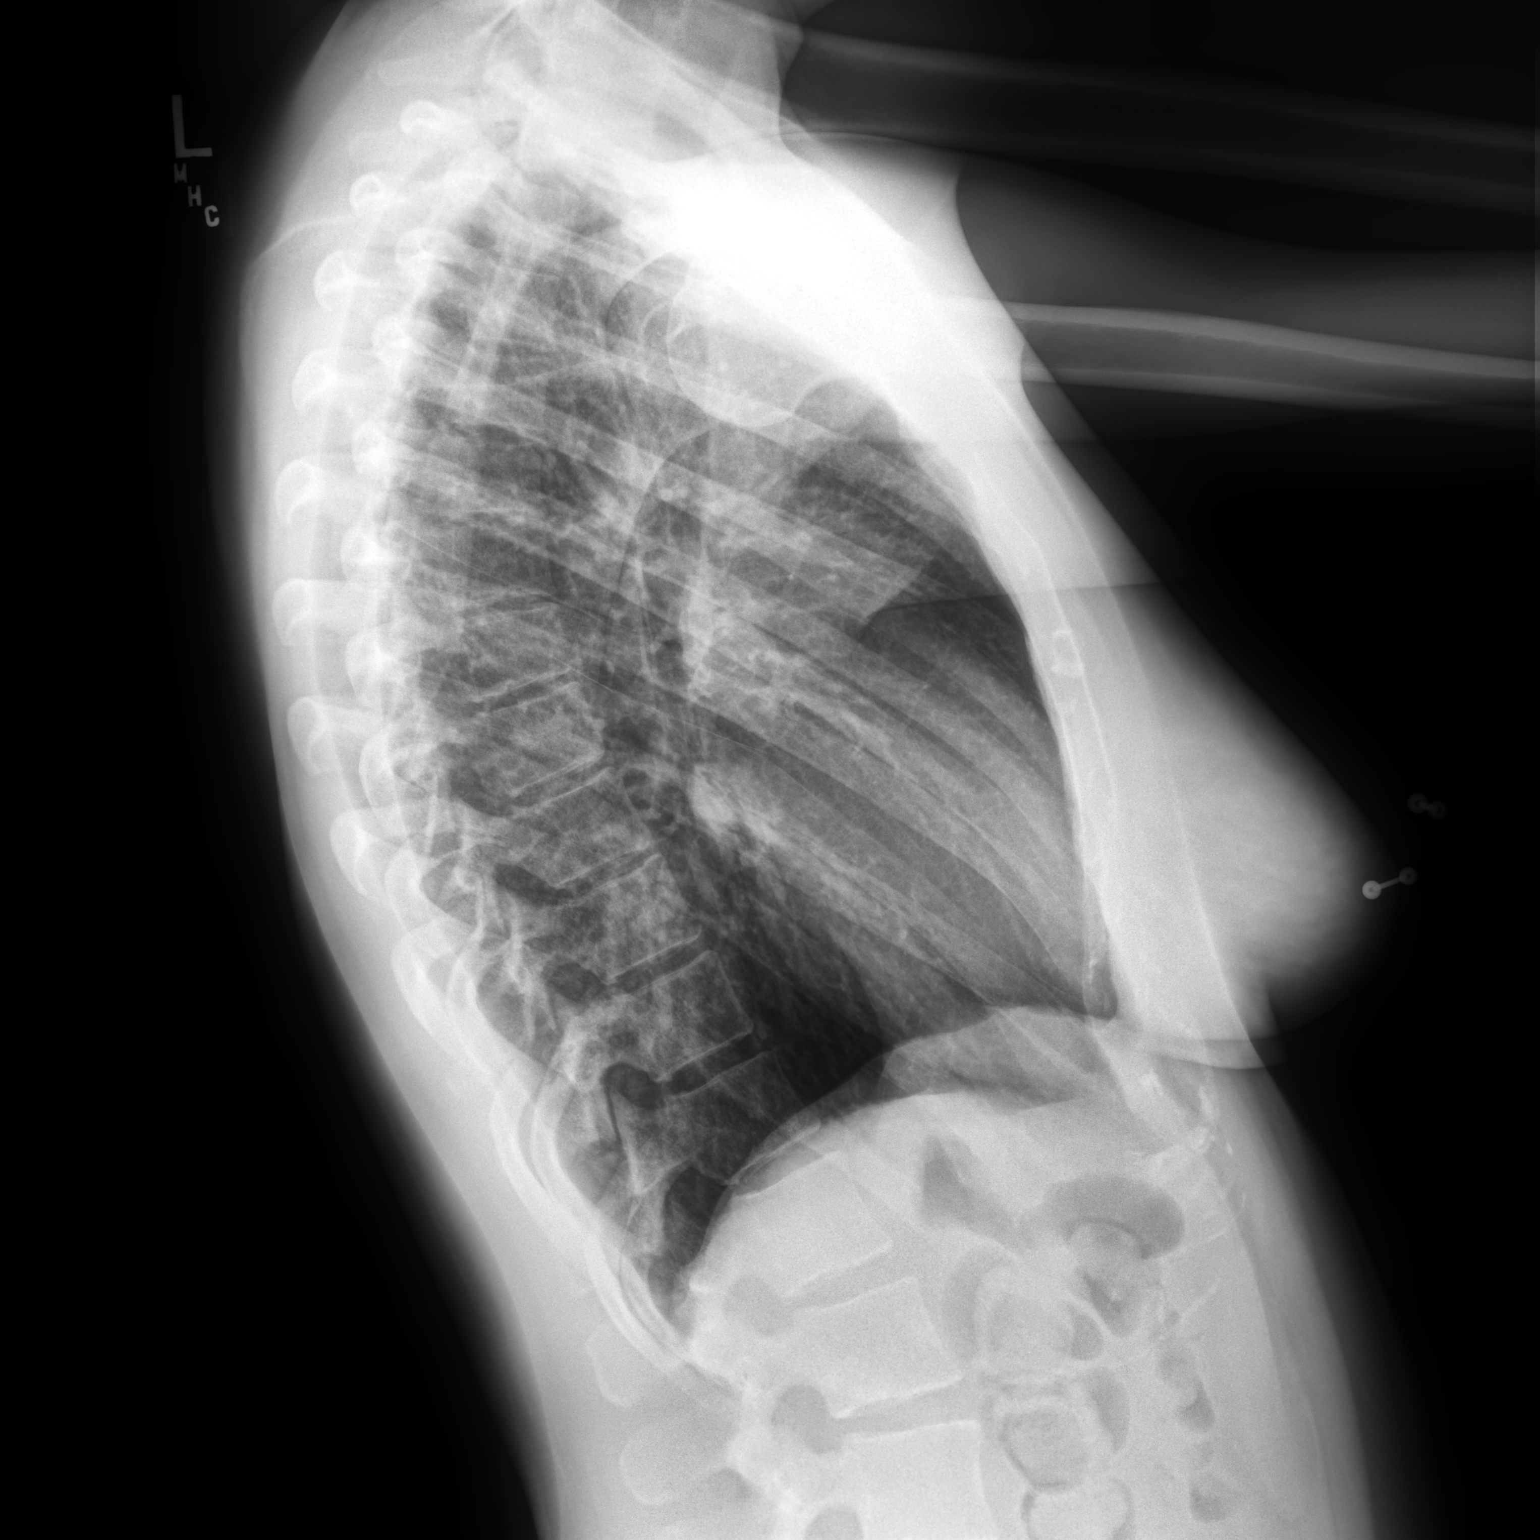

[2 of 2 positions shown; findings below may reference images not displayed]

FINDINGS: Heart size is normal. Lungs are free of focal consolidations and
pleural effusions. No pulmonary edema.
IMPRESSION: No active cardiopulmonary disease.
